# Patient Record
Sex: Male | Born: 1980 | Race: White | Hispanic: No | Marital: Married | Smoking: Never smoker
Health system: Southern US, Community
[De-identification: ages and names within clinical notes are randomized; demographics above are authoritative.]

## PROBLEM LIST (undated history)

## (undated) DIAGNOSIS — I10 Essential (primary) hypertension: Secondary | ICD-10-CM

## (undated) DIAGNOSIS — F1910 Other psychoactive substance abuse, uncomplicated: Secondary | ICD-10-CM

## (undated) DIAGNOSIS — G062 Extradural and subdural abscess, unspecified: Secondary | ICD-10-CM

## (undated) DIAGNOSIS — K759 Inflammatory liver disease, unspecified: Secondary | ICD-10-CM

## (undated) HISTORY — PX: OTHER SURGICAL HISTORY: SHX169

## (undated) HISTORY — PX: BACK SURGERY: SHX140

---

## 2018-03-05 DIAGNOSIS — G062 Extradural and subdural abscess, unspecified: Secondary | ICD-10-CM

## 2018-03-05 HISTORY — DX: Extradural and subdural abscess, unspecified: G06.2

## 2018-03-12 ENCOUNTER — Other Ambulatory Visit: Payer: Self-pay

## 2018-03-12 ENCOUNTER — Inpatient Hospital Stay (HOSPITAL_COMMUNITY)
Admission: EM | Admit: 2018-03-12 | Discharge: 2018-04-09 | DRG: 029 | Disposition: A | Discharge status: Home / Self Care | Payer: Self-pay | Attending: Internal Medicine | Admitting: Internal Medicine

## 2018-03-12 ENCOUNTER — Encounter (HOSPITAL_COMMUNITY): Payer: Self-pay

## 2018-03-12 ENCOUNTER — Emergency Department (HOSPITAL_COMMUNITY): Payer: Self-pay

## 2018-03-12 DIAGNOSIS — Z9114 Patient's other noncompliance with medication regimen: Secondary | ICD-10-CM

## 2018-03-12 DIAGNOSIS — R29898 Other symptoms and signs involving the musculoskeletal system: Secondary | ICD-10-CM

## 2018-03-12 DIAGNOSIS — M609 Myositis, unspecified: Secondary | ICD-10-CM | POA: Diagnosis present

## 2018-03-12 DIAGNOSIS — M869 Osteomyelitis, unspecified: Secondary | ICD-10-CM | POA: Diagnosis present

## 2018-03-12 DIAGNOSIS — F1110 Opioid abuse, uncomplicated: Secondary | ICD-10-CM | POA: Diagnosis present

## 2018-03-12 DIAGNOSIS — G062 Extradural and subdural abscess, unspecified: Secondary | ICD-10-CM | POA: Diagnosis present

## 2018-03-12 DIAGNOSIS — Z91128 Patient's intentional underdosing of medication regimen for other reason: Secondary | ICD-10-CM

## 2018-03-12 DIAGNOSIS — T464X6A Underdosing of angiotensin-converting-enzyme inhibitors, initial encounter: Secondary | ICD-10-CM | POA: Diagnosis present

## 2018-03-12 DIAGNOSIS — G8929 Other chronic pain: Secondary | ICD-10-CM | POA: Diagnosis present

## 2018-03-12 DIAGNOSIS — M545 Low back pain: Secondary | ICD-10-CM | POA: Diagnosis present

## 2018-03-12 DIAGNOSIS — E538 Deficiency of other specified B group vitamins: Secondary | ICD-10-CM

## 2018-03-12 DIAGNOSIS — B954 Other streptococcus as the cause of diseases classified elsewhere: Secondary | ICD-10-CM | POA: Diagnosis present

## 2018-03-12 DIAGNOSIS — G959 Disease of spinal cord, unspecified: Secondary | ICD-10-CM | POA: Diagnosis present

## 2018-03-12 DIAGNOSIS — M546 Pain in thoracic spine: Secondary | ICD-10-CM | POA: Diagnosis present

## 2018-03-12 DIAGNOSIS — R200 Anesthesia of skin: Secondary | ICD-10-CM

## 2018-03-12 DIAGNOSIS — F1990 Other psychoactive substance use, unspecified, uncomplicated: Secondary | ICD-10-CM | POA: Diagnosis present

## 2018-03-12 DIAGNOSIS — Y92009 Unspecified place in unspecified non-institutional (private) residence as the place of occurrence of the external cause: Secondary | ICD-10-CM

## 2018-03-12 DIAGNOSIS — G629 Polyneuropathy, unspecified: Secondary | ICD-10-CM | POA: Diagnosis not present

## 2018-03-12 DIAGNOSIS — Z6836 Body mass index (BMI) 36.0-36.9, adult: Secondary | ICD-10-CM

## 2018-03-12 DIAGNOSIS — E669 Obesity, unspecified: Secondary | ICD-10-CM

## 2018-03-12 DIAGNOSIS — Z79899 Other long term (current) drug therapy: Secondary | ICD-10-CM

## 2018-03-12 DIAGNOSIS — G2581 Restless legs syndrome: Secondary | ICD-10-CM | POA: Diagnosis present

## 2018-03-12 DIAGNOSIS — G061 Intraspinal abscess and granuloma: Principal | ICD-10-CM | POA: Diagnosis present

## 2018-03-12 DIAGNOSIS — I10 Essential (primary) hypertension: Secondary | ICD-10-CM | POA: Diagnosis present

## 2018-03-12 DIAGNOSIS — M4654 Other infective spondylopathies, thoracic region: Secondary | ICD-10-CM | POA: Diagnosis present

## 2018-03-12 HISTORY — DX: Essential (primary) hypertension: I10

## 2018-03-12 HISTORY — DX: Extradural and subdural abscess, unspecified: G06.2

## 2018-03-12 LAB — COMPREHENSIVE METABOLIC PANEL
ALT: 45 U/L — ABNORMAL HIGH (ref 0–44)
AST: 28 U/L (ref 15–41)
Albumin: 2.9 g/dL — ABNORMAL LOW (ref 3.5–5.0)
Alkaline Phosphatase: 125 U/L (ref 38–126)
Anion gap: 10 (ref 5–15)
BILIRUBIN TOTAL: 0.4 mg/dL (ref 0.3–1.2)
BUN: 7 mg/dL (ref 6–20)
CALCIUM: 8.7 mg/dL — AB (ref 8.9–10.3)
CO2: 24 mmol/L (ref 22–32)
Chloride: 99 mmol/L (ref 98–111)
Creatinine, Ser: 0.8 mg/dL (ref 0.61–1.24)
GFR calc Af Amer: >60 mL/min (ref >60)
Glucose, Bld: 117 mg/dL — ABNORMAL HIGH (ref 70–99)
Potassium: 3.9 mmol/L (ref 3.5–5.1)
Sodium: 133 mmol/L — ABNORMAL LOW (ref 135–145)
TOTAL PROTEIN: 7.2 g/dL (ref 6.5–8.1)

## 2018-03-12 LAB — CBC WITH DIFFERENTIAL/PLATELET
Abs Immature Granulocytes: 0.07 10*3/uL (ref 0.00–0.07)
Basophils Absolute: 0 10*3/uL (ref 0.0–0.1)
Basophils Relative: 0 %
Eosinophils Absolute: 0.1 10*3/uL (ref 0.0–0.5)
Eosinophils Relative: 1 %
HCT: 33.5 % — ABNORMAL LOW (ref 39.0–52.0)
Hemoglobin: 10.5 g/dL — ABNORMAL LOW (ref 13.0–17.0)
Immature Granulocytes: 1 %
Lymphocytes Relative: 18 %
Lymphs Abs: 1.5 10*3/uL (ref 0.7–4.0)
MCH: 27.1 pg (ref 26.0–34.0)
MCHC: 31.3 g/dL (ref 30.0–36.0)
MCV: 86.6 fL (ref 80.0–100.0)
MONOS PCT: 11 %
Monocytes Absolute: 0.9 10*3/uL (ref 0.1–1.0)
Neutro Abs: 5.9 10*3/uL (ref 1.7–7.7)
Neutrophils Relative %: 69 %
Platelets: 329 10*3/uL (ref 150–400)
RBC: 3.87 MIL/uL — ABNORMAL LOW (ref 4.22–5.81)
RDW: 14.2 % (ref 11.5–15.5)
WBC: 8.4 10*3/uL (ref 4.0–10.5)
nRBC: 0 % (ref 0.0–0.2)

## 2018-03-12 MED ORDER — LORAZEPAM 2 MG/ML IJ SOLN
1.0000 mg | Freq: Once | INTRAMUSCULAR | Status: AC
  Administered 2018-03-12: 1 mg via INTRAVENOUS
  Filled 2018-03-12: qty 1

## 2018-03-12 MED ORDER — MORPHINE SULFATE (PF) 4 MG/ML IV SOLN
8.0000 mg | Freq: Once | INTRAVENOUS | Status: AC
  Administered 2018-03-12: 8 mg via INTRAVENOUS
  Filled 2018-03-12: qty 2

## 2018-03-12 MED ORDER — ONDANSETRON HCL 4 MG/2ML IJ SOLN
4.0000 mg | Freq: Once | INTRAMUSCULAR | Status: AC
  Administered 2018-03-12: 4 mg via INTRAVENOUS
  Filled 2018-03-12: qty 2

## 2018-03-12 MED ORDER — SODIUM CHLORIDE 0.9 % IV BOLUS
1000.0000 mL | Freq: Once | INTRAVENOUS | Status: AC
  Administered 2018-03-12: 1000 mL via INTRAVENOUS

## 2018-03-12 NOTE — ED Triage Notes (Signed)
Pt states that he has been having back pain since Friday with numbness and tingling in both of his legs. Denies injury

## 2018-03-12 NOTE — ED Provider Notes (Signed)
[facility] EMERGENCY DEPARTMENT Provider Note   CSN: [id number] Arrival date & time: 03/12/18  2047    History   Chief Complaint Chief Complaint  Patient presents with  . Back Pain    HPI James [name] is a 38 y.o. male.     38 yo M with a significant past medical history of chronic back pain comes in with a chief complaint of worsening back pain bilateral lower extremity numbness difficulty with ambulation and difficulty urinating.  This been going on for the past couple days.  Slowly worsening.  Started in both of his legs and now is made its way up about half of his abdomen.  Describes it as a feeling that his legs are asleep, he feels that they are both weak as well and when he walks he feels like he has trouble feeling the ground.  States that he has trouble initiating his urinary stream and also has been having difficulty having a bowel movement.  Denies prior surgery on his back denies recent injection.  He denies fevers or chills.  Denies IV drug abuse.  Denies recent trauma.  The history is provided by the patient.  Back Pain  Location:  Thoracic spine and lumbar spine Quality:  Shooting and stabbing Pain severity:  Moderate Pain is:  Worse during the day Onset quality:  Sudden Timing:  Constant Progression:  Worsening Chronicity:  New Relieved by:  Nothing Worsened by:  Nothing Ineffective treatments:  None tried Associated symptoms: no abdominal pain, no chest pain, no fever and no headaches     Past Medical History:  Diagnosis Date  . Hypertension     There are no active problems to display for this patient.   History reviewed. No pertinent surgical history.      Home Medications    Prior to Admission medications   Medication Sig Start Date End Date Taking? Authorizing Provider  cyclobenzaprine (FLEXERIL) 10 MG tablet Take 10 mg by mouth 2 (two) times daily as needed for muscle spasms.  01/02/18  Yes [provider]    ibuprofen (ADVIL,MOTRIN) 200 MG tablet Take 800 mg by mouth every 4 (four) hours as needed (back pain).   Yes [provider]  lisinopril (PRINIVIL,ZESTRIL) 10 MG tablet Take 10 mg by mouth at bedtime. 01/02/18  Yes [provider]    Family History No family history on file.  Social History Social History   Tobacco Use  . Smoking status: Not on file  Substance Use Topics  . Alcohol use: Not on file  . Drug use: Not on file     Allergies   Patient has no known allergies.   Review of Systems Review of Systems  Constitutional: Negative for chills and fever.  HENT: Negative for congestion and facial swelling.   Eyes: Negative for discharge and visual disturbance.  Respiratory: Negative for shortness of breath.   Cardiovascular: Negative for chest pain and palpitations.  Gastrointestinal: Negative for abdominal pain, diarrhea and vomiting.  Musculoskeletal: Positive for back pain. Negative for arthralgias and myalgias.  Skin: Negative for color change and rash.  Neurological: Negative for tremors, syncope and headaches.  Psychiatric/Behavioral: Negative for confusion and dysphoric mood.     Physical Exam Updated Vital Signs BP 130/83   Pulse (!) 110   Temp 98.3 F (36.8 C) (Oral)   Resp 18   Ht 5\' 9"  (1.753 m)   Wt 113.4 kg   SpO2 98%   BMI 36.92 kg/m  Physical Exam Vitals signs and nursing note reviewed.  Constitutional:      Appearance: He is well-developed.  HENT:     Head: Normocephalic and atraumatic.  Eyes:     Pupils: Pupils are equal, round, and reactive to light.  Neck:     Musculoskeletal: Normal range of motion and neck supple.     Vascular: No JVD.  Cardiovascular:     Rate and Rhythm: Normal rate and regular rhythm.     Heart sounds: No murmur. No friction rub. No gallop.   Pulmonary:     Effort: No respiratory distress.     Breath sounds: No wheezing.  Abdominal:     General: There is no distension.     Tenderness:  There is no guarding or rebound.  Musculoskeletal: Normal range of motion.  Skin:    Coloration: Skin is not pale.     Findings: No rash.  Neurological:     Mental Status: He is alert and oriented to person, place, and time.     Deep Tendon Reflexes: Babinski sign absent on the right side. Babinski sign absent on the left side.     Reflex Scores:      Patellar reflexes are 2+ on the right side and 2+ on the left side.      Achilles reflexes are 2+ on the right side and 2+ on the left side.    Comments: About 4 beats of clonus with right lower extremity intact sensation to light touch in all nerve distributions of the foot.  Bilateral weakness to bilateral lower extremities 5-5 muscle strength bilateral upper extremities.  Psychiatric:        Behavior: Behavior normal.      ED Treatments / Results  Labs (all labs ordered are listed, but only abnormal results are displayed) Labs Reviewed  CBC WITH DIFFERENTIAL/PLATELET - Abnormal; Notable for the following components:      Result Value   RBC 3.87 (*)    Hemoglobin 10.5 (*)    HCT 33.5 (*)    All other components within normal limits  COMPREHENSIVE METABOLIC PANEL - Abnormal; Notable for the following components:   Sodium 133 (*)    Glucose, Bld 117 (*)    Calcium 8.7 (*)    Albumin 2.9 (*)    ALT 45 (*)    All other components within normal limits  URINALYSIS, ROUTINE W REFLEX MICROSCOPIC    EKG None  Radiology No results found.  Procedures Procedures (including critical care time)  Medications Ordered in ED Medications  sodium chloride 0.9 % bolus 1,000 mL (1,000 mLs Intravenous New Bag/Given 03/12/18 2141)  morphine 4 MG/ML injection 8 mg (8 mg Intravenous Given 03/12/18 2144)  ondansetron (ZOFRAN) injection 4 mg (4 mg Intravenous Given 03/12/18 2142)  LORazepam (ATIVAN) injection 1 mg (1 mg Intravenous Given 03/12/18 2226)  gadobutrol (GADAVIST) 1 MMOL/ML injection 10 mL (10 mLs Intravenous Contrast Given 03/13/18 0002)      Initial Impression / Assessment and Plan / ED Course  I have reviewed the triage vital signs and the nursing notes.  Pertinent labs & imaging results that were available during my care of the patient were reviewed by me and considered in my medical decision making (see chart for details).        38 yo M with a chief complaint of bilateral leg numbness and weakness along with low back pain.  Going on for the past couple days.  Story is somewhat concerning for cauda  equina.  Will order an MRI.  He did describe the feeling that the numbness that ascended from his waist up into his rib margin, will discuss with neurology for atypical Guillain-Barr presentation.  I discussed the case with Dr. Amada Jupiter, neurology he recommended an MRI of the C and T-spine based on the history that I provided.  If this is negative then he would have me agree contact him for bedside exam.   Awaiting MRI.   Discussed with Dr. Eudelia Bunch, please see his note for further details of care in the ED.   The patients results and plan were reviewed and discussed.   Any x-rays performed were independently reviewed by myself.   Differential diagnosis were considered with the presenting HPI.  Medications  sodium chloride 0.9 % bolus 1,000 mL (1,000 mLs Intravenous New Bag/Given 03/12/18 2141)  morphine 4 MG/ML injection 8 mg (8 mg Intravenous Given 03/12/18 2144)  ondansetron (ZOFRAN) injection 4 mg (4 mg Intravenous Given 03/12/18 2142)  LORazepam (ATIVAN) injection 1 mg (1 mg Intravenous Given 03/12/18 2226)  gadobutrol (GADAVIST) 1 MMOL/ML injection 10 mL (10 mLs Intravenous Contrast Given 03/13/18 0002)    Vitals:   03/12/18 2053 03/12/18 2111 03/12/18 2151  BP: (!) 146/89 130/83   Pulse: (!) 118 (!) 110   Resp: 20 18   Temp: 98.3 F (36.8 C)    TempSrc: Oral    SpO2: 99% 98%   Weight:   113.4 kg  Height:    (1.753 m)    Final diagnoses:  Leg weakness, bilateral  Bilateral leg numbness        Final Clinical Impressions(s) / ED Diagnoses   Final diagnoses:  Leg weakness, bilateral  Bilateral leg numbness    ED Discharge Orders    None       Melene Plan, DO 03/13/18 0003

## 2018-03-13 ENCOUNTER — Encounter (HOSPITAL_COMMUNITY)
Admission: EM | Disposition: A | Discharge status: Home / Self Care | Payer: Self-pay | Source: Home / Self Care | Attending: Internal Medicine

## 2018-03-13 ENCOUNTER — Emergency Department (HOSPITAL_COMMUNITY): Payer: Self-pay | Admitting: Anesthesiology

## 2018-03-13 ENCOUNTER — Inpatient Hospital Stay (HOSPITAL_COMMUNITY): Payer: Self-pay

## 2018-03-13 ENCOUNTER — Inpatient Hospital Stay: Payer: Self-pay

## 2018-03-13 ENCOUNTER — Encounter (HOSPITAL_COMMUNITY): Payer: Self-pay | Admitting: Emergency Medicine

## 2018-03-13 DIAGNOSIS — G9520 Unspecified cord compression: Secondary | ICD-10-CM

## 2018-03-13 DIAGNOSIS — Z978 Presence of other specified devices: Secondary | ICD-10-CM

## 2018-03-13 DIAGNOSIS — M869 Osteomyelitis, unspecified: Secondary | ICD-10-CM

## 2018-03-13 DIAGNOSIS — R7881 Bacteremia: Secondary | ICD-10-CM

## 2018-03-13 DIAGNOSIS — F1190 Opioid use, unspecified, uncomplicated: Secondary | ICD-10-CM

## 2018-03-13 DIAGNOSIS — I10 Essential (primary) hypertension: Secondary | ICD-10-CM

## 2018-03-13 DIAGNOSIS — L02818 Cutaneous abscess of other sites: Secondary | ICD-10-CM

## 2018-03-13 DIAGNOSIS — G061 Intraspinal abscess and granuloma: Secondary | ICD-10-CM | POA: Diagnosis present

## 2018-03-13 DIAGNOSIS — G062 Extradural and subdural abscess, unspecified: Secondary | ICD-10-CM | POA: Diagnosis present

## 2018-03-13 DIAGNOSIS — M4654 Other infective spondylopathies, thoracic region: Secondary | ICD-10-CM

## 2018-03-13 HISTORY — PX: THORACIC LAMINECTOMY FOR EPIDURAL ABSCESS: SHX6115

## 2018-03-13 LAB — BASIC METABOLIC PANEL
Anion gap: 9 (ref 5–15)
BUN: 7 mg/dL (ref 6–20)
CHLORIDE: 99 mmol/L (ref 98–111)
CO2: 25 mmol/L (ref 22–32)
Calcium: 8.4 mg/dL — ABNORMAL LOW (ref 8.9–10.3)
Creatinine, Ser: 0.87 mg/dL (ref 0.61–1.24)
GFR calc Af Amer: >60 mL/min (ref >60)
GFR calc non Af Amer: >60 mL/min (ref >60)
Glucose, Bld: 135 mg/dL — ABNORMAL HIGH (ref 70–99)
Potassium: 3.5 mmol/L (ref 3.5–5.1)
Sodium: 133 mmol/L — ABNORMAL LOW (ref 135–145)

## 2018-03-13 LAB — GRAM STAIN

## 2018-03-13 LAB — ECHOCARDIOGRAM COMPLETE
Height: 69 in
Weight: 4000 oz

## 2018-03-13 LAB — C-REACTIVE PROTEIN: CRP: 6.9 mg/dL — AB (ref <1.0)

## 2018-03-13 SURGERY — THORACIC LAMINECTOMY FOR EPIDURAL ABSCESS
Anesthesia: General | Site: Spine Thoracic

## 2018-03-13 MED ORDER — DIPHENHYDRAMINE HCL 50 MG/ML IJ SOLN
INTRAMUSCULAR | Status: AC
  Filled 2018-03-13: qty 1

## 2018-03-13 MED ORDER — PHENYLEPHRINE 40 MCG/ML (10ML) SYRINGE FOR IV PUSH (FOR BLOOD PRESSURE SUPPORT)
PREFILLED_SYRINGE | INTRAVENOUS | Status: AC
  Filled 2018-03-13: qty 30

## 2018-03-13 MED ORDER — THROMBIN 5000 UNITS EX SOLR
CUTANEOUS | Status: AC
  Filled 2018-03-13: qty 5000

## 2018-03-13 MED ORDER — EPHEDRINE 5 MG/ML INJ
INTRAVENOUS | Status: AC
  Filled 2018-03-13: qty 10

## 2018-03-13 MED ORDER — SODIUM CHLORIDE (PF) 0.9 % IJ SOLN
INTRAMUSCULAR | Status: AC
  Filled 2018-03-13: qty 10

## 2018-03-13 MED ORDER — GADOBUTROL 1 MMOL/ML IV SOLN
10.0000 mL | Freq: Once | INTRAVENOUS | Status: AC | PRN
  Administered 2018-03-13: 10 mL via INTRAVENOUS

## 2018-03-13 MED ORDER — HYDROMORPHONE HCL 1 MG/ML IJ SOLN: 1.0000 mg | INTRAMUSCULAR | Status: DC | PRN

## 2018-03-13 MED ORDER — VANCOMYCIN HCL 1000 MG IV SOLR
INTRAVENOUS | Status: DC | PRN
  Administered 2018-03-13: 1000 mg via TOPICAL

## 2018-03-13 MED ORDER — ONDANSETRON HCL 4 MG PO TABS: 4.0000 mg | ORAL_TABLET | Freq: Four times a day (QID) | ORAL | Status: DC | PRN

## 2018-03-13 MED ORDER — GLYCOPYRROLATE PF 0.2 MG/ML IJ SOSY
PREFILLED_SYRINGE | INTRAMUSCULAR | Status: AC
  Filled 2018-03-13: qty 1

## 2018-03-13 MED ORDER — HYDROMORPHONE HCL 1 MG/ML IJ SOLN
INTRAMUSCULAR | Status: AC
  Filled 2018-03-13: qty 1

## 2018-03-13 MED ORDER — ONDANSETRON HCL 4 MG/2ML IJ SOLN: 4.0000 mg | Freq: Four times a day (QID) | INTRAMUSCULAR | Status: DC | PRN

## 2018-03-13 MED ORDER — LIDOCAINE 2% (20 MG/ML) 5 ML SYRINGE
INTRAMUSCULAR | Status: AC
  Filled 2018-03-13: qty 10

## 2018-03-13 MED ORDER — PHENYLEPHRINE 40 MCG/ML (10ML) SYRINGE FOR IV PUSH (FOR BLOOD PRESSURE SUPPORT)
PREFILLED_SYRINGE | INTRAVENOUS | Status: AC
  Filled 2018-03-13: qty 10

## 2018-03-13 MED ORDER — ONDANSETRON HCL 4 MG/2ML IJ SOLN
INTRAMUSCULAR | Status: DC | PRN
  Administered 2018-03-13: 4 mg via INTRAVENOUS

## 2018-03-13 MED ORDER — DIAZEPAM 5 MG PO TABS
5.0000 mg | ORAL_TABLET | Freq: Four times a day (QID) | ORAL | Status: DC | PRN
  Administered 2018-03-14 – 2018-03-21 (×9): 5 mg via ORAL
  Administered 2018-03-22: 10 mg via ORAL
  Administered 2018-03-23 – 2018-04-01 (×9): 5 mg via ORAL
  Administered 2018-04-02: 10 mg via ORAL
  Administered 2018-04-03: 5 mg via ORAL
  Administered 2018-04-04: 10 mg via ORAL
  Administered 2018-04-05 – 2018-04-06 (×2): 5 mg via ORAL
  Filled 2018-03-13: qty 1
  Filled 2018-03-13: qty 2
  Filled 2018-03-13 (×11): qty 1
  Filled 2018-03-13 (×2): qty 2
  Filled 2018-03-13 (×10): qty 1

## 2018-03-13 MED ORDER — SODIUM CHLORIDE 0.9 % IV SOLN: 2.0000 g | Freq: Once | INTRAVENOUS | Status: DC

## 2018-03-13 MED ORDER — SODIUM CHLORIDE 0.9% FLUSH: 3.0000 mL | INTRAVENOUS | Status: DC | PRN

## 2018-03-13 MED ORDER — OXYCODONE HCL 5 MG/5ML PO SOLN: 5.0000 mg | Freq: Once | ORAL | Status: DC | PRN

## 2018-03-13 MED ORDER — MIDAZOLAM HCL 5 MG/5ML IJ SOLN
INTRAMUSCULAR | Status: DC | PRN
  Administered 2018-03-13: 2 mg via INTRAVENOUS

## 2018-03-13 MED ORDER — SODIUM CHLORIDE 0.9 % IV SOLN
1.0000 g | Freq: Once | INTRAVENOUS | Status: AC
  Administered 2018-03-13: 1 g via INTRAVENOUS
  Filled 2018-03-13: qty 10

## 2018-03-13 MED ORDER — MIDAZOLAM HCL 2 MG/2ML IJ SOLN
INTRAMUSCULAR | Status: AC
  Filled 2018-03-13: qty 2

## 2018-03-13 MED ORDER — BISACODYL 10 MG RE SUPP: 10.0000 mg | Freq: Every day | RECTAL | Status: DC | PRN

## 2018-03-13 MED ORDER — LIDOCAINE HCL (CARDIAC) PF 100 MG/5ML IV SOSY
PREFILLED_SYRINGE | INTRAVENOUS | Status: DC | PRN
  Administered 2018-03-13: 60 mg via INTRATRACHEAL

## 2018-03-13 MED ORDER — OXYCODONE HCL 5 MG PO TABS
10.0000 mg | ORAL_TABLET | ORAL | Status: DC | PRN
  Administered 2018-03-13 – 2018-03-15 (×8): 10 mg via ORAL
  Filled 2018-03-13 (×8): qty 2

## 2018-03-13 MED ORDER — DIPHENHYDRAMINE HCL 50 MG/ML IJ SOLN
INTRAMUSCULAR | Status: DC | PRN
  Administered 2018-03-13: 25 mg via INTRAVENOUS

## 2018-03-13 MED ORDER — ROCURONIUM BROMIDE 50 MG/5ML IV SOSY
PREFILLED_SYRINGE | INTRAVENOUS | Status: AC
  Filled 2018-03-13: qty 15

## 2018-03-13 MED ORDER — PHENYLEPHRINE HCL 10 MG/ML IJ SOLN
INTRAMUSCULAR | Status: DC | PRN
  Administered 2018-03-13 (×2): 40 ug via INTRAVENOUS

## 2018-03-13 MED ORDER — MENTHOL 3 MG MT LOZG: 1.0000 | LOZENGE | OROMUCOSAL | Status: DC | PRN

## 2018-03-13 MED ORDER — PROMETHAZINE HCL 25 MG/ML IJ SOLN: 6.2500 mg | INTRAMUSCULAR | Status: DC | PRN

## 2018-03-13 MED ORDER — POLYETHYLENE GLYCOL 3350 17 G PO PACK
17.0000 g | PACK | Freq: Every day | ORAL | Status: DC | PRN
  Administered 2018-03-14: 17 g via ORAL
  Filled 2018-03-13: qty 1

## 2018-03-13 MED ORDER — ARTIFICIAL TEARS OPHTHALMIC OINT
TOPICAL_OINTMENT | OPHTHALMIC | Status: AC
  Filled 2018-03-13: qty 3.5

## 2018-03-13 MED ORDER — SUCCINYLCHOLINE CHLORIDE 200 MG/10ML IV SOSY
PREFILLED_SYRINGE | INTRAVENOUS | Status: AC
  Filled 2018-03-13: qty 30

## 2018-03-13 MED ORDER — ACETAMINOPHEN 10 MG/ML IV SOLN
INTRAVENOUS | Status: AC
  Filled 2018-03-13: qty 100

## 2018-03-13 MED ORDER — ACETAMINOPHEN 10 MG/ML IV SOLN
1000.0000 mg | Freq: Once | INTRAVENOUS | Status: DC | PRN
  Administered 2018-03-13: 1000 mg via INTRAVENOUS

## 2018-03-13 MED ORDER — FENTANYL CITRATE (PF) 250 MCG/5ML IJ SOLN
INTRAMUSCULAR | Status: AC
  Filled 2018-03-13: qty 5

## 2018-03-13 MED ORDER — FLEET ENEMA 7-19 GM/118ML RE ENEM: 1.0000 | ENEMA | Freq: Once | RECTAL | Status: DC | PRN

## 2018-03-13 MED ORDER — THROMBIN 20000 UNITS EX SOLR
CUTANEOUS | Status: DC | PRN
  Administered 2018-03-13: 04:00:00 via TOPICAL

## 2018-03-13 MED ORDER — SODIUM CHLORIDE 0.9 % IV SOLN: 250.0000 mL | INTRAVENOUS | Status: DC

## 2018-03-13 MED ORDER — SODIUM CHLORIDE 0.9 % IV SOLN
INTRAVENOUS | Status: DC | PRN
  Administered 2018-03-13: 04:00:00

## 2018-03-13 MED ORDER — SUGAMMADEX SODIUM 500 MG/5ML IV SOLN
INTRAVENOUS | Status: AC
  Filled 2018-03-13: qty 5

## 2018-03-13 MED ORDER — PROPOFOL 10 MG/ML IV BOLUS
INTRAVENOUS | Status: AC
  Filled 2018-03-13: qty 40

## 2018-03-13 MED ORDER — THROMBIN 20000 UNITS EX SOLR
CUTANEOUS | Status: AC
  Filled 2018-03-13: qty 20000

## 2018-03-13 MED ORDER — SODIUM CHLORIDE 0.9 % IV BOLUS
1000.0000 mL | Freq: Once | INTRAVENOUS | Status: AC
  Administered 2018-03-13: 1000 mL via INTRAVENOUS

## 2018-03-13 MED ORDER — SODIUM CHLORIDE 0.9% FLUSH
3.0000 mL | Freq: Two times a day (BID) | INTRAVENOUS | Status: DC
  Administered 2018-03-13 – 2018-04-05 (×8): 3 mL via INTRAVENOUS

## 2018-03-13 MED ORDER — VANCOMYCIN HCL 1000 MG IV SOLR
INTRAVENOUS | Status: AC
  Filled 2018-03-13: qty 1000

## 2018-03-13 MED ORDER — VANCOMYCIN HCL IN DEXTROSE 1-5 GM/200ML-% IV SOLN
INTRAVENOUS | Status: AC
  Filled 2018-03-13: qty 200

## 2018-03-13 MED ORDER — PROPOFOL 10 MG/ML IV BOLUS
INTRAVENOUS | Status: DC | PRN
  Administered 2018-03-13: 60 mg via INTRAVENOUS
  Administered 2018-03-13: 200 mg via INTRAVENOUS

## 2018-03-13 MED ORDER — METRONIDAZOLE IN NACL 5-0.79 MG/ML-% IV SOLN: 500.0000 mg | Freq: Four times a day (QID) | INTRAVENOUS | Status: DC

## 2018-03-13 MED ORDER — LISINOPRIL 10 MG PO TABS
10.0000 mg | ORAL_TABLET | Freq: Every day | ORAL | Status: DC
  Administered 2018-03-13 – 2018-03-14 (×2): 10 mg via ORAL
  Filled 2018-03-13 (×2): qty 1

## 2018-03-13 MED ORDER — LACTATED RINGERS IV SOLN
INTRAVENOUS | Status: DC | PRN
  Administered 2018-03-13 (×2): via INTRAVENOUS

## 2018-03-13 MED ORDER — FENTANYL CITRATE (PF) 250 MCG/5ML IJ SOLN
INTRAMUSCULAR | Status: DC | PRN
  Administered 2018-03-13 (×4): 100 ug via INTRAVENOUS
  Administered 2018-03-13 (×2): 50 ug via INTRAVENOUS

## 2018-03-13 MED ORDER — HYDROCODONE-ACETAMINOPHEN 10-325 MG PO TABS
1.0000 | ORAL_TABLET | ORAL | Status: DC | PRN
  Administered 2018-03-13 – 2018-03-15 (×8): 1 via ORAL
  Filled 2018-03-13 (×8): qty 1

## 2018-03-13 MED ORDER — VANCOMYCIN HCL 10 G IV SOLR
1500.0000 mg | Freq: Two times a day (BID) | INTRAVENOUS | Status: DC
  Administered 2018-03-13 – 2018-03-14 (×3): 1500 mg via INTRAVENOUS
  Filled 2018-03-13 (×3): qty 1500

## 2018-03-13 MED ORDER — KETOROLAC TROMETHAMINE 30 MG/ML IJ SOLN
INTRAMUSCULAR | Status: DC | PRN
  Administered 2018-03-13: 30 mg via INTRAVENOUS

## 2018-03-13 MED ORDER — BUPIVACAINE HCL (PF) 0.25 % IJ SOLN
INTRAMUSCULAR | Status: DC | PRN
  Administered 2018-03-13: 20 mL

## 2018-03-13 MED ORDER — THROMBIN 5000 UNITS EX SOLR
OROMUCOSAL | Status: DC | PRN
  Administered 2018-03-13: 04:00:00 via TOPICAL

## 2018-03-13 MED ORDER — ACETAMINOPHEN 325 MG PO TABS: 650.0000 mg | ORAL_TABLET | ORAL | Status: DC | PRN

## 2018-03-13 MED ORDER — KETOROLAC TROMETHAMINE 30 MG/ML IJ SOLN: 30.0000 mg | Freq: Once | INTRAMUSCULAR | Status: DC

## 2018-03-13 MED ORDER — VANCOMYCIN HCL 1000 MG IV SOLR
INTRAVENOUS | Status: DC | PRN
  Administered 2018-03-13: 1000 mg via INTRAVENOUS

## 2018-03-13 MED ORDER — PHENOL 1.4 % MT LIQD: 1.0000 | OROMUCOSAL | Status: DC | PRN

## 2018-03-13 MED ORDER — ONDANSETRON HCL 4 MG/2ML IJ SOLN
INTRAMUSCULAR | Status: AC
  Filled 2018-03-13: qty 2

## 2018-03-13 MED ORDER — BACITRACIN ZINC 500 UNIT/GM EX OINT
TOPICAL_OINTMENT | CUTANEOUS | Status: DC | PRN
  Administered 2018-03-13: 1 via TOPICAL

## 2018-03-13 MED ORDER — BUPIVACAINE HCL (PF) 0.25 % IJ SOLN
INTRAMUSCULAR | Status: AC
  Filled 2018-03-13: qty 30

## 2018-03-13 MED ORDER — HYDROMORPHONE HCL 1 MG/ML IJ SOLN
0.2500 mg | INTRAMUSCULAR | Status: DC | PRN
  Administered 2018-03-13 (×2): 0.5 mg via INTRAVENOUS

## 2018-03-13 MED ORDER — ACETAMINOPHEN 650 MG RE SUPP: 650.0000 mg | RECTAL | Status: DC | PRN

## 2018-03-13 MED ORDER — SODIUM CHLORIDE 0.9 % IV SOLN
1.0000 g | Freq: Two times a day (BID) | INTRAVENOUS | Status: DC
  Administered 2018-03-13 (×2): 1 g via INTRAVENOUS
  Filled 2018-03-13 (×3): qty 10

## 2018-03-13 MED ORDER — SUGAMMADEX SODIUM 500 MG/5ML IV SOLN
INTRAVENOUS | Status: DC | PRN
  Administered 2018-03-13: 300 mg via INTRAVENOUS

## 2018-03-13 MED ORDER — OXYCODONE HCL 5 MG PO TABS: 5.0000 mg | ORAL_TABLET | Freq: Once | ORAL | Status: DC | PRN

## 2018-03-13 MED ORDER — 0.9 % SODIUM CHLORIDE (POUR BTL) OPTIME
TOPICAL | Status: DC | PRN
  Administered 2018-03-13: 1000 mL

## 2018-03-13 MED ORDER — SUCCINYLCHOLINE CHLORIDE 20 MG/ML IJ SOLN
INTRAMUSCULAR | Status: DC | PRN
  Administered 2018-03-13: 120 mg via INTRAVENOUS

## 2018-03-13 MED ORDER — ROCURONIUM BROMIDE 100 MG/10ML IV SOLN
INTRAVENOUS | Status: DC | PRN
  Administered 2018-03-13: 50 mg via INTRAVENOUS
  Administered 2018-03-13: 20 mg via INTRAVENOUS

## 2018-03-13 SURGICAL SUPPLY — 53 items
BAG DECANTER FOR FLEXI CONT (MISCELLANEOUS) ×3 IMPLANT
BENZOIN TINCTURE PRP APPL 2/3 (GAUZE/BANDAGES/DRESSINGS) ×3 IMPLANT
BLADE CLIPPER SURG (BLADE) ×3 IMPLANT
BUR CUTTER 7.0 ROUND (BURR) ×3 IMPLANT
BUR MATCHSTICK NEURO 3.0 LAGG (BURR) ×3 IMPLANT
CANISTER SUCT 3000ML PPV (MISCELLANEOUS) ×3 IMPLANT
CARTRIDGE OIL MAESTRO DRILL (MISCELLANEOUS) ×1 IMPLANT
CLOSURE WOUND 1/2 X4 (GAUZE/BANDAGES/DRESSINGS) ×1
DERMABOND ADVANCED (GAUZE/BANDAGES/DRESSINGS) ×2
DERMABOND ADVANCED .7 DNX12 (GAUZE/BANDAGES/DRESSINGS) ×1 IMPLANT
DIFFUSER DRILL AIR PNEUMATIC (MISCELLANEOUS) ×3 IMPLANT
DRAPE LAPAROTOMY 100X72X124 (DRAPES) ×3 IMPLANT
DRAPE MICROSCOPE LEICA (MISCELLANEOUS) ×3 IMPLANT
DRAPE SURG 17X23 STRL (DRAPES) ×6 IMPLANT
DRSG OPSITE 4X5.5 SM (GAUZE/BANDAGES/DRESSINGS) ×3 IMPLANT
DRSG OPSITE POSTOP 4X6 (GAUZE/BANDAGES/DRESSINGS) ×3 IMPLANT
ELECT REM PT RETURN 9FT ADLT (ELECTROSURGICAL) ×3
ELECTRODE REM PT RTRN 9FT ADLT (ELECTROSURGICAL) ×1 IMPLANT
EVACUATOR 1/8 PVC DRAIN (DRAIN) ×3 IMPLANT
GAUZE 4X4 16PLY RFD (DISPOSABLE) IMPLANT
GAUZE SPONGE 4X4 12PLY STRL (GAUZE/BANDAGES/DRESSINGS) IMPLANT
GLOVE BIOGEL PI IND STRL 7.0 (GLOVE) ×2 IMPLANT
GLOVE BIOGEL PI IND STRL 7.5 (GLOVE) ×2 IMPLANT
GLOVE BIOGEL PI IND STRL 8 (GLOVE) ×2 IMPLANT
GLOVE BIOGEL PI INDICATOR 7.0 (GLOVE) ×4
GLOVE BIOGEL PI INDICATOR 7.5 (GLOVE) ×4
GLOVE BIOGEL PI INDICATOR 8 (GLOVE) ×4
GLOVE ECLIPSE 7.5 STRL STRAW (GLOVE) ×3 IMPLANT
GLOVE ECLIPSE 9.0 STRL (GLOVE) ×3 IMPLANT
GOWN STRL REUS W/ TWL LRG LVL3 (GOWN DISPOSABLE) IMPLANT
GOWN STRL REUS W/ TWL XL LVL3 (GOWN DISPOSABLE) ×1 IMPLANT
GOWN STRL REUS W/TWL 2XL LVL3 (GOWN DISPOSABLE) ×6 IMPLANT
GOWN STRL REUS W/TWL LRG LVL3 (GOWN DISPOSABLE)
GOWN STRL REUS W/TWL XL LVL3 (GOWN DISPOSABLE) ×2
HEMOSTAT POWDER KIT SURGIFOAM (HEMOSTASIS) ×3 IMPLANT
KIT BASIN OR (CUSTOM PROCEDURE TRAY) ×3 IMPLANT
KIT TURNOVER KIT B (KITS) ×3 IMPLANT
NEEDLE HYPO 22GX1.5 SAFETY (NEEDLE) ×3 IMPLANT
NEEDLE HYPO 25X1 1.5 SAFETY (NEEDLE) ×3 IMPLANT
NS IRRIG 1000ML POUR BTL (IV SOLUTION) ×3 IMPLANT
OIL CARTRIDGE MAESTRO DRILL (MISCELLANEOUS) ×3
PACK LAMINECTOMY NEURO (CUSTOM PROCEDURE TRAY) ×3 IMPLANT
RUBBERBAND STERILE (MISCELLANEOUS) ×6 IMPLANT
SPONGE SURGIFOAM ABS GEL 100 (HEMOSTASIS) ×3 IMPLANT
STRIP CLOSURE SKIN 1/2X4 (GAUZE/BANDAGES/DRESSINGS) ×2 IMPLANT
SUT VIC AB 0 CT1 18XCR BRD8 (SUTURE) ×1 IMPLANT
SUT VIC AB 0 CT1 8-18 (SUTURE) ×2
SUT VIC AB 2-0 CT1 18 (SUTURE) ×3 IMPLANT
SUT VIC AB 3-0 SH 8-18 (SUTURE) ×3 IMPLANT
TOWEL GREEN STERILE (TOWEL DISPOSABLE) ×3 IMPLANT
TOWEL GREEN STERILE FF (TOWEL DISPOSABLE) ×3 IMPLANT
TRAY FOLEY MTR SLVR 16FR STAT (SET/KITS/TRAYS/PACK) IMPLANT
WATER STERILE IRR 1000ML POUR (IV SOLUTION) ×3 IMPLANT

## 2018-03-13 NOTE — Anesthesia Postprocedure Evaluation (Signed)
Anesthesia Post Note  Patient: James [name]  Procedure(s) Performed: THORACIC LAMINECTOMY FOR EPIDURAL ABSCESS (N/A Spine Thoracic)     Patient location during evaluation: PACU Anesthesia Type: General Level of consciousness: awake and alert Pain management: pain level controlled Vital Signs Assessment: post-procedure vital signs reviewed and stable Respiratory status: spontaneous breathing, nonlabored ventilation, respiratory function stable and patient connected to nasal cannula oxygen Cardiovascular status: blood pressure returned to baseline and stable Postop Assessment: no apparent nausea or vomiting Anesthetic complications: no    Last Vitals:  Vitals:   03/13/18 0549 03/13/18 0604  BP:  118/81  Pulse: 94 92  Resp: (!) 21 20  Temp: (!) 36.3 C 36.9 C  SpO2: 100% 99%    Last Pain:  Vitals:   03/13/18 0604  TempSrc: Oral  PainSc: 9                  Seibert Keeter P Rasmus Preusser

## 2018-03-13 NOTE — Op Note (Signed)
Date of procedure: 03/13/2018  Date of dictation: Same  Service: Neurosurgery  Preoperative diagnosis: T3-T4 epidural abscess with myelopathy  Postoperative diagnosis: Same  Procedure Name: Emergent T3-4 decompressive laminectomy with evacuation of epidural abscess, resection of epidural phlegmon, microdissection  Surgeon:Divina Neale A.Llana Deshazo, M.D.  Asst. Surgeon: None  Anesthesia: General  Indication: 38 year old male with history of IV drug abuse presents with mid back pain with bilateral lower extremity numbness paresthesias and some weakness.  Work-up demonstrates evidence of a large dorsal fluid collection in his upper thoracic spinal canal consistent with an epidural abscess.  Patient presents now for emergent decompressive surgery.  Operative note: After induction of anesthesia, patient position prone onto bolsters with his head fixed in the [place] pin headrest.  Patient's upper thoracic region was prepped and draped sterilely.  Incision made overlying T free T4.  Retractor placed.  Laminectomy then performed using Leksell rongeurs Kerrison under the high-speed drill to remove the entire lamina of L3 and the majority of the lamina of L4.  Ligament flavum elevated and resected.  Epidural pus was encountered.  Samples were taken and sent to the lab for stat culture.  Ligament flavum elevated and resected.  There was a dense epidural phlegmon which required micro dissection using the microscope to dissected from the underlying thecal sac.  At this point a very thorough decompression had been achieved.  There was no evidence of injury to thecal sac, spinal cord or nerve roots.  Wound is then irrigated fanlike solution.  Gelfoam was placed topically for hemostasis then removed.  Vancomycin powder was placed in deep wound space.  A medium Hemovac drain was left in the epidural space.  Wounds and closed in layers of Vicryl sutures.  Steri-Strips and sterile dressing were applied.  No apparent  complications.  Patient tolerated the procedure well and he returns to the recovery room postop.

## 2018-03-13 NOTE — Progress Notes (Signed)
Patient received from PACU at 0604;alert and oriented in stable condition. Pt oriented to room and plan of care. Call bell left within reach. Patient instructed not to get up without assistance. He voiced understanding.

## 2018-03-13 NOTE — Progress Notes (Signed)
  Echocardiogram 2D Echocardiogram has been performed.  James [name] 03/13/2018, 2:00 PM

## 2018-03-13 NOTE — Care Management Note (Signed)
Case Management Note  Patient Details  Name: James [name] MRN: [id number] Date of Birth: October 28, 1980  Subjective/Objective:                    Action/Plan:  Confirmed face sheet address and phone number.   Patient lives with wife. Patient has a PCP in [place], however does not know the MD's name or practice name or address. Patient thinks he may have insurance for one or two more days, but does not know insurance name. His wife has all information and will be visiting today. He will not be able to follow up with his PCP once insurance expires.Patient interested in [facility].   Patient last used Heroin on Saturday March 18, 2018 . Would not be candidate for home IV ABX with PICC  Will continue to follow.  Expected Discharge Date:                  Expected Discharge Plan:     In-House Referral:  Financial Counselor  Discharge planning Services  CM Consult  Post Acute Care Choice:    Choice offered to:  Patient  DME Arranged:    DME Agency:     HH Arranged:    HH Agency:     Status of Service:  In process, will continue to follow  If discussed at Long Length of Stay Meetings, dates discussed:    Additional Comments:  Kingsley Plan, RN 03/13/2018, 11:20 AM

## 2018-03-13 NOTE — Evaluation (Signed)
Physical Therapy Evaluation Patient Details Name: James [name] MRN: [id number] DOB: 1980/07/09 Today's Date: 03/13/2018   History of Present Illness  38 yo admitted with back pain and some progressive numbness and weakness bil LEs and UEs, difficulty walking; Now s/p Emergent T3-4 decompressive laminectomy with evacuation of epidural abscess, resection of epidural phlegmon, microdissection;  has a past medical history of Hypertension.and IV drug use  Clinical Impression   Patient is s/p above surgery resulting in functional limitations due to the deficits listed below (see PT Problem List). Independent prior to admission; presenting overall moving well, with some gait deviations related to bil LE dyscoordination, but he reports it is much improved compared to pre-op; I anticipate one more PT session for gait and stair training;  Patient will benefit from skilled PT to increase their independence and safety with mobility to allow discharge to the venue listed below.       Follow Up Recommendations No PT follow up    Equipment Recommendations  None recommended by PT    Recommendations for Other Services       Precautions / Restrictions Precautions Precautions: Back(for comfort)      Mobility  Bed Mobility Overal bed mobility: Independent                Transfers Overall transfer level: Independent                  Ambulation/Gait Ambulation/Gait assistance: Supervision;Modified independent (Device/Increase time) Gait Distance (Feet): 550 Feet Assistive device: None Gait Pattern/deviations: Step-through pattern Gait velocity: aprpoaching WNL   General Gait Details: Overall walking well; noting heavy footfalls R and L, indicative of slightly decr stability in stance; reports feeling like he is weak and uncoordinated; Supervision, progressing to Modifiend independence; Noted less stance stability in R stance than L  Stairs            Wheelchair Mobility    Modified Rankin (Stroke Patients Only)       Balance Overall balance assessment: Independent                                           Pertinent Vitals/Pain Pain Assessment: 0-10 Pain Score: 6  Pain Location: upper back pain Pain Descriptors / Indicators: Aching Pain Intervention(s): Premedicated before session    Home Living Family/patient expects to be discharged to:: Private residence Living Arrangements: Spouse/significant other Available Help at Discharge: Family;Available PRN/intermittently Type of Home: House Home Access: Stairs to enter Entrance Stairs-Rails: None Entrance Stairs-Number of Steps: 4 Home Layout: Two level        Prior Function Level of Independence: Independent               Hand Dominance        Extremity/Trunk Assessment   Upper Extremity Assessment Upper Extremity Assessment: Overall WFL for tasks assessed    Lower Extremity Assessment Lower Extremity Assessment: RLE deficits/detail;LLE deficits/detail RLE Deficits / Details: Reports feeling less stabe in stance both R and LLEs, R worse than L RLE Coordination: decreased gross motor LLE Deficits / Details: Reports feeling less stabe in stance both R and LLEs, R worse than L       Communication   Communication: No difficulties  Cognition Arousal/Alertness: Awake/alert Behavior During Therapy: WFL for tasks assessed/performed Overall Cognitive Status: Within Functional Limits for tasks assessed  General Comments General comments (skin integrity, edema, etc.): Taught back precautions for comfort    Exercises     Assessment/Plan    PT Assessment Patient needs continued PT services  PT Problem List Decreased activity tolerance;Pain       PT Treatment Interventions Gait training;Stair training;Functional mobility training    PT Goals (Current goals can be found in the Care Plan section)  Acute  Rehab PT Goals Patient Stated Goal: heal the infection PT Goal Formulation: With patient Time For Goal Achievement: 03/20/18 Potential to Achieve Goals: Good    Frequency Min 3X/week   Barriers to discharge        Co-evaluation               AM-PAC PT "6 Clicks" Mobility  Outcome Measure Help needed turning from your back to your side while in a flat bed without using bedrails?: None Help needed moving from lying on your back to sitting on the side of a flat bed without using bedrails?: None Help needed moving to and from a bed to a chair (including a wheelchair)?: None Help needed standing up from a chair using your arms (e.g., wheelchair or bedside chair)?: None Help needed to walk in hospital room?: None Help needed climbing 3-5 steps with a railing? : None 6 Click Score: 24    End of Session   Activity Tolerance: Patient tolerated treatment well Patient left: in bed;with call bell/phone within reach Nurse Communication: Mobility status PT Visit Diagnosis: Unsteadiness on feet (R26.81);Pain Pain - part of body: (Upper back)    Time: 1114-1130 PT Time Calculation (min) (ACUTE ONLY): 16 min   Charges:   PT Evaluation $PT Eval Low Complexity: 1 Low          Van Clines, PT  Acute Rehabilitation Services Pager [phone] Office [phone]   Levi Aland 03/13/2018, 1:34 PM

## 2018-03-13 NOTE — Consult Note (Signed)
[facility]    Date of Admission:  03/12/2018     Total days of antibiotics 0               Reason for Consult: Epidural abscess / osteomyelitis   Referring Provider: Pool Primary Care Provider: Patient, No Pcp Per   Assessment/Plan:  James [name] is a 38 y/o male IVDU presenting with increasing back pain, lower extremity weakness, and difficulty urinating found to have an epidural abscess, epidural intraspinal abscess and right 3rd rib osteomyelitis/abscess s/p emergent decompressive laminectomy and abscess evacuation. Surgical specimen with gram stain showing no organisms and cultures pending. Blood cultures remain pending. Agree with continuation of broad spectrum coverage with vancomycin and ceftriaxone pending culture results. He will require prolonged IV therapy and is not a candidate for home health given his active heroin usage and will likely need to remain inpatient for the duration of treatment. He is interested in treatment and has tried Suboxone in the past.   1. Agree with broad spectrum coverage with vancomycin and ceftriaxone with narrowing pending culture results as able. 2. Monitor for fevers and culture results.  3. Monitor renal function while on vancomycin with adjustments per pharmacy. 4. Check Hepatitis C and HIV. 5. Opioid use disorder per primary team. May benefit from IM Teaching Services consult for Suboxone.    Active Problems:   Epidural abscess   Epidural intraspinal abscess   . HYDROmorphone      . lisinopril  10 mg Oral QHS  . sodium chloride flush  3 mL Intravenous Q12H     HPI: James [name] is a 37 y.o. male hypertension and IV drug use who presented to the ED with acute onset worsening back pain with bilateral lower extremity weakness and difficulty urinating for about 24 hours prior to presentation.  MRI of the cervical and thoracic spine showing a dorsal epidural phelgmon T1 through T4-5 with 8 x 10 x 34 mm  epidural abscess from T2-3 through T4-5 with resultant severe cord compression and edema. There is Left T8-9, right T9-10, and left T10-11 facet septic arthritis; and right third rib 2.8 / 2.5 cm osteomyelitis/abscess.   Neurosurgery consulted and performed emergent T3-4 decompressive laminectomy with evacuation of epidural abscess and resection of epidural phelgmon. During the procedure epidural pus was encountered and sent for cultures.  James [name] denies any fevers prior to presentation and has not been on antibiotics. Has had worsening back pain. No previous history of trauma or significant injury to his back. Is an active IV drug user with primary being heroin that injects into his right upper extremity with the last use being on Saturday. No fevers since being admitted to the hospital. Blood cultures and surgical specimens are pending. There has been no growth to date on gram stains. Current antimicrobial therapy with broad spectrum vancomycin and ceftriaxone.   Review of Systems: Review of Systems  Constitutional: Negative for chills, fever and weight loss.  Respiratory: Negative for cough, shortness of breath and wheezing.   Cardiovascular: Negative for chest pain and leg swelling.  Gastrointestinal: Negative for abdominal pain, constipation, diarrhea, nausea and vomiting.  Musculoskeletal: Positive for back pain (upper back).  Skin: Negative for rash.  Neurological: Positive for weakness. Negative for dizziness, seizures and headaches.     Past Medical History:  Diagnosis Date  . Hypertension     Social History   Tobacco Use  . Smoking status: Not on file  Substance  Use Topics  . Alcohol use: Not on file  . Drug use: Not on file    History reviewed. No pertinent family history.  No Known Allergies  OBJECTIVE: Blood pressure 118/81, pulse 92, temperature 98.5 F (36.9 C), temperature source Oral, resp. rate 20, height 5\' 9"  (1.753 m), weight 113.4 kg, SpO2 99  %.  Physical Exam Constitutional:      General: He is not in acute distress.    Appearance: He is well-developed.     Comments: Seated on the side of the bed; pleasant.   Cardiovascular:     Rate and Rhythm: Normal rate and regular rhythm.     Heart sounds: Normal heart sounds. No murmur.  Pulmonary:     Effort: Pulmonary effort is normal. No respiratory distress.     Breath sounds: Normal breath sounds. No wheezing or rhonchi.  Musculoskeletal:     Comments: Surgical site appear with honeycomb dressing hemovac drain in place and to suction. There is serosanguinous drainage present.   Skin:    General: Skin is warm and dry.  Neurological:     Mental Status: He is alert.  Psychiatric:        Mood and Affect: Mood normal.        Judgment: Judgment normal.     Lab Results Lab Results  Component Value Date   WBC 8.4 03/12/2018   HGB 10.5 (L) 03/12/2018   HCT 33.5 (L) 03/12/2018   MCV 86.6 03/12/2018   PLT 329 03/12/2018    Lab Results  Component Value Date   CREATININE 0.87 03/13/2018   BUN 7 03/13/2018   NA 133 (L) 03/13/2018   K 3.5 03/13/2018   CL 99 03/13/2018   CO2 25 03/13/2018    Lab Results  Component Value Date   ALT 45 (H) 03/12/2018   AST 28 03/12/2018   ALKPHOS 125 03/12/2018   BILITOT 0.4 03/12/2018     Microbiology: Recent Results (from the past 240 hour(s))  Gram stain     Status: None   Collection Time: 03/13/18  3:43 AM  Result Value Ref Range Status   Specimen Description ABSCESS  Final   Special Requests THORACIC EPIDURAL AEROBIC SWAB ONLY PER DR POOL  Final   Gram Stain   Final    FEW WBC PRESENT, PREDOMINANTLY PMN NO ORGANISMS SEEN Performed at [facility], [address] [place], [place] [address]    Report Status 03/13/2018 FINAL  Final     Marcos Eke, NP [facility] [phone] Pager  03/13/2018  10:52 AM

## 2018-03-13 NOTE — Transfer of Care (Signed)
Immediate Anesthesia Transfer of Care Note  Patient: James [name]  Procedure(s) Performed: THORACIC LAMINECTOMY FOR EPIDURAL ABSCESS (N/A Spine Thoracic)  Patient Location: PACU  Anesthesia Type:General  Level of Consciousness: drowsy  Airway & Oxygen Therapy: Patient Spontanous Breathing and Patient connected to face mask oxygen  Post-op Assessment: Report given to RN and Post -op Vital signs reviewed and stable  Post vital signs: Reviewed and stable  Last Vitals:  Vitals Value Taken Time  BP    Temp    Pulse 95 03/13/2018  5:07 AM  Resp 27 03/13/2018  5:07 AM  SpO2 100 % 03/13/2018  5:07 AM  Vitals shown include unvalidated device data.  Last Pain:  Vitals:   03/13/18 0131  TempSrc:   PainSc: 10-Worst pain ever         Complications: No apparent anesthesia complications

## 2018-03-13 NOTE — ED Provider Notes (Signed)
I assumed care of this patient from Dr. Adela Lank at 0000.  Please see their note for further details of Hx, PE.  Briefly patient is a 38 y.o. male who presented with back pain with numbness and +clonus concerning for cord compression awaiting MRI.   MRI notable for epidural and right rib abscesses.  Patient initially declined IV drug use to Dr. Adela Lank however he admitted to recent IV drug use to me.  Will discuss case with neurosurgery and start patient on empiric antibiotics.  I spoke with Dr. Dutch Quint who will take the patient to the OR to drain.  He requested holding antibiotics at this time until cultures from the abscess were obtained.  CRITICAL CARE Performed by: Amadeo Garnet Teisha Trowbridge Total critical care time: 30 minutes Critical care time was exclusive of separately billable procedures and treating other patients. Critical care was necessary to treat or prevent imminent or life-threatening deterioration. Critical care was time spent personally by me on the following activities: development of treatment plan with patient and/or surrogate as well as nursing, discussions with consultants, evaluation of patient's response to treatment, examination of patient, obtaining history from patient or surrogate, ordering and performing treatments and interventions, ordering and review of laboratory studies, ordering and review of radiographic studies, pulse oximetry and re-evaluation of patient's condition.     Nira Conn, MD 03/13/18 [phone]

## 2018-03-13 NOTE — Plan of Care (Signed)
  Problem: Pain Managment: Goal: General experience of comfort will improve Outcome: Progressing   Problem: Safety: Goal: Ability to remain free from injury will improve Outcome: Progressing   Problem: Skin Integrity: Goal: Risk for impaired skin integrity will decrease Outcome: Progressing   

## 2018-03-13 NOTE — H&P (Signed)
James [name] is an 38 y.o. male.   Chief Complaint: Numbness HPI: 38 year old male with history of IV drug abuse presents with back pain and increasing numbness and some weakness in the both lower extremities.  No fever.  No chills.  No other areas of pain or problem.  Symptoms been gradually progressing.  Difficulty ambulating.  No incontinence.  Past Medical History:  Diagnosis Date  . Hypertension     History reviewed. No pertinent surgical history.  No family history on file. Social History:  has no history on file for tobacco, alcohol, and drug.  Allergies: No Known Allergies  (Not in a hospital admission)   Results for orders placed or performed during the hospital encounter of 03/12/18 (from the past 48 hour(s))  CBC with Differential     Status: Abnormal   Collection Time: 03/12/18  9:48 PM  Result Value Ref Range   WBC 8.4 4.0 - 10.5 K/uL   RBC 3.87 (L) 4.22 - 5.81 MIL/uL   Hemoglobin 10.5 (L) 13.0 - 17.0 g/dL   HCT 43.5 (L) 68.6 - 16.8 %   MCV 86.6 80.0 - 100.0 fL   MCH 27.1 26.0 - 34.0 pg   MCHC 31.3 30.0 - 36.0 g/dL   RDW 37.2 90.2 - 11.1 %   Platelets 329 150 - 400 K/uL   nRBC 0.0 0.0 - 0.2 %   Neutrophils Relative % 69 %   Neutro Abs 5.9 1.7 - 7.7 K/uL   Lymphocytes Relative 18 %   Lymphs Abs 1.5 0.7 - 4.0 K/uL   Monocytes Relative 11 %   Monocytes Absolute 0.9 0.1 - 1.0 K/uL   Eosinophils Relative 1 %   Eosinophils Absolute 0.1 0.0 - 0.5 K/uL   Basophils Relative 0 %   Basophils Absolute 0.0 0.0 - 0.1 K/uL   Immature Granulocytes 1 %   Abs Immature Granulocytes 0.07 0.00 - 0.07 K/uL    Comment: Performed at [facility], [address], [place], [place] [address]  Comprehensive metabolic panel     Status: Abnormal   Collection Time: 03/12/18  9:48 PM  Result Value Ref Range   Sodium 133 (L) 135 - 145 mmol/L   Potassium 3.9 3.5 - 5.1 mmol/L   Chloride 99 98 - 111 mmol/L   CO2 24 22 - 32 mmol/L   Glucose, Bld 117 (H) 70 - 99 mg/dL   BUN 7 6  - 20 mg/dL   Creatinine, Ser 0.22 0.61 - 1.24 mg/dL   Calcium 8.7 (L) 8.9 - 10.3 mg/dL   Total Protein 7.2 6.5 - 8.1 g/dL   Albumin 2.9 (L) 3.5 - 5.0 g/dL   AST 28 15 - 41 U/L   ALT 45 (H) 0 - 44 U/L   Alkaline Phosphatase 125 38 - 126 U/L   Total Bilirubin 0.4 0.3 - 1.2 mg/dL   GFR calc non Af Amer >60 >60 mL/min   GFR calc Af Amer >60 >60 mL/min   Anion gap 10 5 - 15    Comment: Performed at [facility], [address] [place], [place] [address]   Mr Cervical Spine W Or Wo Contrast  Result Date: 03/13/2018 CLINICAL DATA:  Severe pain, numbness below the waist since March 10, 2018. Assess for cauda equina syndrome. EXAM: MRI CERVICAL AND THORACIC SPINE WITHOUT AND WITH CONTRAST TECHNIQUE: Multiplanar and multiecho pulse sequences of the cervical spine, to include the craniocervical junction and cervicothoracic junction, and thoracic spine, were obtained  without and with intravenous contrast. CONTRAST:  10 cc Gadavist COMPARISON:  None. FINDINGS: MRI CERVICAL SPINE FINDINGS-moderately motion degraded examination. ALIGNMENT: Straightened cervical lordosis.  No malalignment. VERTEBRAE/DISCS: Vertebral bodies are intact. Intervertebral disc morphology's and signal are normal. CORD:Cervical spinal cord is normal morphology and signal characteristics from the cervicomedullary junction to level of T2-3, the most caudal well visualized level. POSTERIOR FOSSA, VERTEBRAL ARTERIES, PARASPINAL TISSUES: No MR findings of ligamentous injury. Vertebral artery flow voids present. Included posterior fossa and paraspinal soft tissues are normal. DISC LEVELS: C2-3: Large RIGHT subarticular disc protrusion. No canal stenosis. Severe RIGHT neural foraminal narrowing. C3-4: Annular bulging eccentric laterally. No canal stenosis. Moderate RIGHT neural foraminal narrowing. C4-5: Small LEFT central disc protrusion without canal stenosis. Mild suspected LEFT neural foraminal narrowing. C5-6: Moderate LEFT subarticular  disc protrusion. No canal stenosis. Severe LEFT neural foraminal narrowing. C6-7: Annular bulging asymmetric to LEFT. No canal stenosis. Mild LEFT neural foraminal narrowing. C7-T1: No disc bulge, canal stenosis nor neural foraminal narrowing. MRI THORACIC SPINE FINDINGS ALIGNMENT: Maintenance of the thoracic kyphosis. No malalignment. VERTEBRAE/DISCS: Vertebral bodies are intact. Multilevel mild disc desiccation and chronic discogenic endplate changes. Bright STIR signal and enhancement within the posterior elements of T3. No abnormal disc enhancement. Enhancing bright STIR signal LEFT T8-9, RIGHT T9-10 and LEFT T10-11 facet with masslike appearance. RIGHT third rib 2.8 x 2.5 cm enhancing mass. CORD: Dorsal epidural phlegmon from T1 through T4-5, 8 x 10 x 34 mm (AP by transverse by cc) dorsal epidural abscess from T2-3 through T4-5. Cord compression from T3 through T4 with mild cord edema at this level. No syrinx. PREVERTEBRAL AND PARASPINAL SOFT TISSUES: Interstitial STIR signal and enhancement within the interspinous space and paraspinal muscles at T1-2 through T4-5 consistent with infection. DISC LEVELS: Small broad-based disc bulge at T6-7. Moderate LEFT central to subarticular disc protrusion T8-9. IMPRESSION: MRI cervical spine: 1. Moderately motion degraded examination. No acute osseous process. 2. No canal stenosis. Neural foraminal narrowing C2-3 through C6-7: Severe on the RIGHT at C2-3 and severe on the LEFT at C5-6 comment; this may be overestimated by motion artifact. MRI thoracic spine: 1. Dorsal epidural phlegmon T1 through T4-5 with 8 x 10 x 34 mm epidural abscess from T2-3 through T4-5. Resultant severe cord compression and cord edema. 2. LEFT T8-9, RIGHT T9-10 and LEFT T10-11 facet septic arthritis. Associated paraspinal myositis. 3. 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 Consider CT chest with contrast. Critical Value/emergent results were  called by telephone at the time of interpretation on 03/13/2018 at 12:59 am to Dr. Melene Plan , who verbally acknowledged these results. Electronically Signed   By: Awilda Metro M.D.   On: 03/13/2018 01:00   Mr Thoracic Spine W Wo Contrast  Result Date: 03/13/2018 CLINICAL DATA:  Severe pain, numbness below the waist since March 10, 2018. Assess for cauda equina syndrome. EXAM: MRI CERVICAL AND THORACIC SPINE WITHOUT AND WITH CONTRAST TECHNIQUE: Multiplanar and multiecho pulse sequences of the cervical spine, to include the craniocervical junction and cervicothoracic junction, and thoracic spine, were obtained without and with intravenous contrast. CONTRAST:  10 cc Gadavist COMPARISON:  None. FINDINGS: MRI CERVICAL SPINE FINDINGS-moderately motion degraded examination. ALIGNMENT: Straightened cervical lordosis.  No malalignment. VERTEBRAE/DISCS: Vertebral bodies are intact. Intervertebral disc morphology's and signal are normal. CORD:Cervical spinal cord is normal morphology and signal characteristics from the cervicomedullary junction to level of T2-3, the most caudal well visualized level. POSTERIOR FOSSA, VERTEBRAL ARTERIES, PARASPINAL TISSUES:  No MR findings of ligamentous injury. Vertebral artery flow voids present. Included posterior fossa and paraspinal soft tissues are normal. DISC LEVELS: C2-3: Large RIGHT subarticular disc protrusion. No canal stenosis. Severe RIGHT neural foraminal narrowing. C3-4: Annular bulging eccentric laterally. No canal stenosis. Moderate RIGHT neural foraminal narrowing. C4-5: Small LEFT central disc protrusion without canal stenosis. Mild suspected LEFT neural foraminal narrowing. C5-6: Moderate LEFT subarticular disc protrusion. No canal stenosis. Severe LEFT neural foraminal narrowing. C6-7: Annular bulging asymmetric to LEFT. No canal stenosis. Mild LEFT neural foraminal narrowing. C7-T1: No disc bulge, canal stenosis nor neural foraminal narrowing. MRI THORACIC SPINE  FINDINGS ALIGNMENT: Maintenance of the thoracic kyphosis. No malalignment. VERTEBRAE/DISCS: Vertebral bodies are intact. Multilevel mild disc desiccation and chronic discogenic endplate changes. Bright STIR signal and enhancement within the posterior elements of T3. No abnormal disc enhancement. Enhancing bright STIR signal LEFT T8-9, RIGHT T9-10 and LEFT T10-11 facet with masslike appearance. RIGHT third rib 2.8 x 2.5 cm enhancing mass. CORD: Dorsal epidural phlegmon from T1 through T4-5, 8 x 10 x 34 mm (AP by transverse by cc) dorsal epidural abscess from T2-3 through T4-5. Cord compression from T3 through T4 with mild cord edema at this level. No syrinx. PREVERTEBRAL AND PARASPINAL SOFT TISSUES: Interstitial STIR signal and enhancement within the interspinous space and paraspinal muscles at T1-2 through T4-5 consistent with infection. DISC LEVELS: Small broad-based disc bulge at T6-7. Moderate LEFT central to subarticular disc protrusion T8-9. IMPRESSION: MRI cervical spine: 1. Moderately motion degraded examination. No acute osseous process. 2. No canal stenosis. Neural foraminal narrowing C2-3 through C6-7: Severe on the RIGHT at C2-3 and severe on the LEFT at C5-6 comment; this may be overestimated by motion artifact. MRI thoracic spine: 1. Dorsal epidural phlegmon T1 through T4-5 with 8 x 10 x 34 mm epidural abscess from T2-3 through T4-5. Resultant severe cord compression and cord edema. 2. LEFT T8-9, RIGHT T9-10 and LEFT T10-11 facet septic arthritis. Associated paraspinal myositis. 3. 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 Consider CT chest with contrast. Critical Value/emergent results were called by telephone at the time of interpretation on 03/13/2018 at 12:59 am to Dr. Melene Plan , who verbally acknowledged these results. Electronically Signed   By: Awilda Metro M.D.   On: 03/13/2018 01:00    Pertinent items noted in HPI and remainder of  comprehensive ROS otherwise negative.  Blood pressure 117/67, pulse 96, temperature 98.3 F (36.8 C), temperature source Oral, resp. rate 18, height  (1.753 m), weight 113.4 kg, SpO2 100 %.  Patient is awake and alert.  He is oriented and appropriate.  Speech is fluent.  Judgment and insight appear intact.  Cranial nerve function normal bilaterally.  Motor examination upper extremities 5/5.  Motor examination of lower extremities 4+/5 with increased tone.  Sensory examination with some partial sensory level in his upper thoracic region.  Deep tender if is normal active in both upper extremities.  Increased reflexes in both lower extremities.  Positive clonus.  Toes upgoing to plantar stimulation.  Examination head ears eyes nose throat is unremarked.  Chest and abdomen are benign.  Extremities are free from injury or deformity. 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  Given the degree of mass-effect and the patient's current symptoms I think he is at high risk of spinal cord injury if treated with antibiotics alone.  I recommend that  we move to the operating room for T3-T4 decompressive laminectomy with evacuation of epidural abscess.  I discussed the risks involved with surgery including but not limited to the risk of anesthesia, bleeding, infection, CSF leak, nerve root injury, spinal cord injury, later instability, continued pain, and non-benefit.  The patient also with evidence of osteomyelitis in his lower thoracic spine without evidence of epidural abscess or stenosis.  Plan for infectious disease consult and further work-up after surgery has been performed.  Sherilyn CooterHenry A Keyairra [name] 03/13/2018, 2:11 AM

## 2018-03-13 NOTE — Brief Op Note (Signed)
03/13/2018  4:51 AM  PATIENT:  James [name]  38 y.o. male  PRE-OPERATIVE DIAGNOSIS:  Epidural Abscess  POST-OPERATIVE DIAGNOSIS:  Epidural Abscess  PROCEDURE:  Procedure(s): THORACIC LAMINECTOMY FOR EPIDURAL ABSCESS (N/A)  SURGEON:  Surgeon(s) and Role:    * Julio Sicks, MD - Primary  PHYSICIAN ASSISTANT:   ASSISTANTS:    ANESTHESIA:   general  EBL:  300 mL   BLOOD ADMINISTERED:none  DRAINS: (med) Hemovact drain(s) in the epidural space with  Suction Open   LOCAL MEDICATIONS USED:  MARCAINE     SPECIMEN:  No Specimen  DISPOSITION OF SPECIMEN:  NA  COUNTS:  YES  TOURNIQUET:  * No tourniquets in log *  DICTATION: .Dragon Dictation  PLAN OF CARE: Admit to inpatient   PATIENT DISPOSITION:  PACU - hemodynamically stable.   Delay start of Pharmacological VTE agent (>24hrs) due to surgical blood loss or risk of bleeding: yes

## 2018-03-13 NOTE — Anesthesia Preprocedure Evaluation (Addendum)
Anesthesia Evaluation  Patient identified by MRN, date of birth, ID band Patient awake    Reviewed: Allergy & Precautions, NPO status , Patient's Chart, lab work & pertinent test results  Airway Mallampati: III  TM Distance: >3 FB Neck ROM: Full    Dental no notable dental hx.    Pulmonary neg pulmonary ROS,    Pulmonary exam normal breath sounds clear to auscultation       Cardiovascular hypertension, Pt. 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 with significant spinal cord compression negative psych ROS   GI/Hepatic negative GI ROS, (+)     substance abuse  IV drug use,   Endo/Other  negative endocrine ROS  Renal/GU negative Renal ROS     Musculoskeletal negative musculoskeletal ROS (+)   Abdominal (+) + obese,   Peds  Hematology  (+) anemia ,   Anesthesia Other Findings Epidural Abscess  Reproductive/Obstetrics                            Anesthesia Physical Anesthesia Plan  ASA: III and emergent  Anesthesia Plan: General   Post-op Pain Management:    Induction: Intravenous  PONV Risk Score and Plan: 3 and Ondansetron, Midazolam and Treatment may vary due to age or medical condition  Airway Management Planned: Oral ETT  Additional Equipment:   Intra-op Plan:   Post-operative Plan: Extubation in OR  Informed Consent: I have reviewed the patients History and Physical, chart, labs and discussed the procedure including the risks, benefits and alternatives for the proposed anesthesia with the patient or authorized representative who has indicated his/her understanding and acceptance.     Dental advisory given  Plan Discussed with: CRNA  Anesthesia Plan Comments:         Anesthesia Quick Evaluation

## 2018-03-13 NOTE — Progress Notes (Signed)
Per ID MD, hold PICC until [facility] resulted

## 2018-03-13 NOTE — Progress Notes (Addendum)
Pharmacy Antibiotic Note  James [name] is a 38 y.o. male admitted on 03/12/2018 with epidural abscess, septic arthritis, osteomyelitis. Pt is now post-op.  Pharmacy has been consulted for Vancomycin dosing. WBC WNL. Renal function good.   Plan: -Vancomycin 1500 mg IV q12h >>Estimated AUC 481 -Ceftriaxone per MD  -Trend WBC, temp, renal function  -F/U infectious work-up -Drug levels as indicated   Height: 5\' 9"  (175.3 cm) Weight: 250 lb (113.4 kg) IBW/kg (Calculated) : 70.7  Temp (24hrs), Avg:97.9 F (36.6 C), Min:97.3 F (36.3 C), Max:98.5 F (36.9 C)  Recent Labs  Lab 03/12/18 2148  WBC 8.4  CREATININE 0.80    Estimated Creatinine Clearance: 158.5 mL/min (by C-G formula based on SCr of 0.8 mg/dL).    No Known Allergies    Abran Duke 03/13/2018 6:25 AM

## 2018-03-13 NOTE — Anesthesia Procedure Notes (Signed)
Procedure Name: Intubation Date/Time: 03/13/2018 3:29 AM Performed by: Claudina Lick, CRNA Pre-anesthesia Checklist: Patient identified, Emergency Drugs available, Suction available, Patient being monitored and Timeout performed Patient Re-evaluated:Patient Re-evaluated prior to induction Oxygen Delivery Method: Circle system utilized Preoxygenation: Pre-oxygenation with 100% oxygen Induction Type: IV induction and Rapid sequence Laryngoscope Size: Miller and 2 Grade View: Grade I Tube type: Oral Tube size: 7.5 mm Number of attempts: 1 Airway Equipment and Method: Stylet Placement Confirmation: ETT inserted through vocal cords under direct vision,  positive ETCO2 and breath sounds checked- equal and bilateral Secured at: 23 cm Tube secured with: Tape Dental Injury: Teeth and Oropharynx as per pre-operative assessment

## 2018-03-14 ENCOUNTER — Encounter (HOSPITAL_COMMUNITY): Payer: Self-pay | Admitting: Neurosurgery

## 2018-03-14 DIAGNOSIS — G629 Polyneuropathy, unspecified: Secondary | ICD-10-CM

## 2018-03-14 DIAGNOSIS — I10 Essential (primary) hypertension: Secondary | ICD-10-CM | POA: Diagnosis present

## 2018-03-14 DIAGNOSIS — B954 Other streptococcus as the cause of diseases classified elsewhere: Secondary | ICD-10-CM

## 2018-03-14 DIAGNOSIS — F1990 Other psychoactive substance use, unspecified, uncomplicated: Secondary | ICD-10-CM

## 2018-03-14 LAB — HIV ANTIBODY (ROUTINE TESTING W REFLEX): HIV Screen 4th Generation wRfx: NONREACTIVE

## 2018-03-14 MED ORDER — IBUPROFEN 400 MG PO TABS
400.0000 mg | ORAL_TABLET | Freq: Three times a day (TID) | ORAL | Status: DC
  Administered 2018-03-14 (×2): 400 mg via ORAL
  Filled 2018-03-14 (×2): qty 1

## 2018-03-14 MED ORDER — SODIUM CHLORIDE 0.9 % IV SOLN
1.0000 g | Freq: Two times a day (BID) | INTRAVENOUS | Status: DC
  Administered 2018-03-14 – 2018-03-15 (×3): 1 g via INTRAVENOUS
  Filled 2018-03-14 (×3): qty 10

## 2018-03-14 MED ORDER — GABAPENTIN 100 MG PO CAPS
100.0000 mg | ORAL_CAPSULE | Freq: Three times a day (TID) | ORAL | Status: DC
  Administered 2018-03-14 (×2): 100 mg via ORAL
  Filled 2018-03-14 (×2): qty 1

## 2018-03-14 NOTE — Progress Notes (Signed)
[facility]  Date of Admission:  03/12/2018     Total days of antibiotics 1         ASSESSMENT/PLAN  James [name] has an epidural abscess, epidural intraspinous abscess and right 3rd rib osteomyelitis/abscess s/p decompressive laminectomy and abscess evacuation. Symptoms appear to be improving slowly with residual neuropathy present. Surgical cultures remain pending and blood cultures without growth to date. TTE reviewed with no vegetations present. Plan to continue current broad spectrum antibiotics with vancomycin and ceftriaxone.  Please do not place a PICC line until blood cultures have been cleared.  1. Continue current dose of vancomycin and ceftriaxone. 2. Monitor cultures and fevers. 3. Monitor vancomycin trough and renal function while on vancomycin. 4. Hold on PICC line until blood cultures cleared.   Active Problems:   Epidural abscess   Epidural intraspinal abscess   . lisinopril  10 mg Oral QHS  . sodium chloride flush  3 mL Intravenous Q12H    SUBJECTIVE:  Mildly elevated temperature of 100.1 this morning. No acute events overnight. Blood cultures with no growth to date. Surgical specimen with no organism on gram stain and cultures pending. TTE performed with no evidence of vegetation.   Increased back pain today with continued neuropathy. Has been up and moving around.  No Known Allergies   Review of Systems: Review of Systems  Constitutional: Negative for chills, fever and weight loss.  Respiratory: Negative for cough, shortness of breath and wheezing.   Cardiovascular: Negative for chest pain and leg swelling.  Gastrointestinal: Negative for abdominal pain, constipation, diarrhea, nausea and vomiting.  Musculoskeletal: Positive for back pain.  Skin: Negative for rash.      OBJECTIVE: Vitals:   03/13/18 2143 03/14/18 0217 03/14/18 0512 03/14/18 0935  BP: 103/61 106/64 115/64 (!) 115/56  Pulse: (!) 104 94 93 95  Resp: 19 18  18    Temp: 99 F (37.2 C) 99.5 F (37.5 C) 100.1 F (37.8 C) 98.3 F (36.8 C)  TempSrc: Oral Oral Oral   SpO2: 100% 100% 100% 100%  Weight:      Height:       Body mass index is 36.92 kg/m.  Physical Exam Constitutional:      General: He is not in acute distress.    Appearance: He is well-developed.  Cardiovascular:     Rate and Rhythm: Normal rate and regular rhythm.     Heart sounds: Normal heart sounds.  Pulmonary:     Effort: Pulmonary effort is normal.     Breath sounds: Normal breath sounds.  Skin:    General: Skin is warm and dry.  Neurological:     Mental Status: He is alert and oriented to person, place, and time.  Psychiatric:        Behavior: Behavior normal.        Thought Content: Thought content normal.        Judgment: Judgment normal.     Lab Results Lab Results  Component Value Date   WBC 8.4 03/12/2018   HGB 10.5 (L) 03/12/2018   HCT 33.5 (L) 03/12/2018   MCV 86.6 03/12/2018   PLT 329 03/12/2018    Lab Results  Component Value Date   CREATININE 0.87 03/13/2018   BUN 7 03/13/2018   NA 133 (L) 03/13/2018   K 3.5 03/13/2018   CL 99 03/13/2018   CO2 25 03/13/2018    Lab Results  Component Value Date   ALT 45 (H) 03/12/2018  AST 28 03/12/2018   ALKPHOS 125 03/12/2018   BILITOT 0.4 03/12/2018     Microbiology: Recent Results (from the past 240 hour(s))  Blood culture (routine x 2)     Status: None (Preliminary result)   Collection Time: 03/13/18  2:26 AM  Result Value Ref Range Status   Specimen Description BLOOD RIGHT ANTECUBITAL  Final   Special Requests   Final    BOTTLES DRAWN AEROBIC AND ANAEROBIC Blood Culture adequate volume   Culture   Final    NO GROWTH 1 DAY Performed at [facility], [address] [place], [place] [address]    Report Status PENDING  Incomplete  Blood culture (routine x 2)     Status: None (Preliminary result)   Collection Time: 03/13/18  2:27 AM  Result Value Ref Range Status   Specimen  Description BLOOD RIGHT HAND  Final   Special Requests   Final    BOTTLES DRAWN AEROBIC ONLY Blood Culture results may not be optimal due to an excessive volume of blood received in culture bottles   Culture   Final    NO GROWTH 1 DAY Performed at [facility], [address] [place], [place] [address]    Report Status PENDING  Incomplete  Gram stain     Status: None   Collection Time: 03/13/18  3:43 AM  Result Value Ref Range Status   Specimen Description ABSCESS  Final   Special Requests THORACIC EPIDURAL AEROBIC SWAB ONLY PER DR POOL  Final   Gram Stain   Final    FEW WBC PRESENT, PREDOMINANTLY PMN NO ORGANISMS SEEN Performed at [facility], [address] [place], [place] [address]    Report Status 03/13/2018 FINAL  Final     Marcos Eke, NP [facility]   03/14/2018  9:44 AM

## 2018-03-14 NOTE — Consult Note (Signed)
[facility] Medical Consultation  James [name] KPV:[id number] DOB: 1980/03/07 DOA: 03/12/2018 PCP: Patient, No Pcp Per   Requesting physician: Julio Sicks MD Date of consultation: 03/14/2018 Reason for consultation: Medical problems including hypertension and drug abuse.  Impression/Recommendations Active Problems:   Epidural abscess   Epidural intraspinal abscess   IVDU (intravenous drug user)   Essential hypertension    1.  Essential hypertension: Chronic medical problem.  Patient used to be on lisinopril and was not taking recently.  Lisinopril 10 mg was resumed.  His blood pressures are stable.  Continue same doses for now.  2.  Epidural abscess on a patient with IV drug use: Status post emergent evacuation.  Pus culture and blood cultures are pending.  2D echocardiogram with no evidence of vegetation.  Currently remains on vancomycin and Rocephin.  Followed by ID.  Continue current doses until final cultures.  3.  IV drug use: Patient is stated intermittent use of heroin.  Last use 3 days ago.  Patient stated that he goes clean for 1 to 2 weeks without withdrawals. Patient currently immediate postop, needing IV and oral opiates.  We discussed about cessation of IV drug use. Patient is motivated and agreeable. We will start patient on gabapentin 100 mg 3 times a day and gradually increase the doses. Will start scheduled doses of ibuprofen in hopes of reducing the need for oxycodone use. Patient will be started on Flexeril after his Valium doses are over.  I will followup again tomorrow. Please contact me if I can be of assistance in the meanwhile. Thank you for this consultation.  Chief Complaint: Back pain.  HPI:  38 year old gentleman with history of hypertension intermittently on lisinopril, chronic back pain issues, stated he used to be on tramadol and Flexeril.  For last 5 years he will use pain medications, and he will use heroin whenever he does not have any opiate  prescriptions.  He does have back pain issues however since last Saturday, he had increasing back pain, lower extremity weakness so he came to ER.  In the emergency room, he was found to have thoracic epidural abscess, epidural intraspinal abscess and right third rib osteomyelitis.  He underwent emergent decompressive laminectomy and abscess evacuation.  Blood cultures are pending.  Blood cultures are pending.  He has been started on vancomycin and Rocephin.  Surgically improving as appropriate.  Medicine is consulted because of history of hypertension and drug abuse. Patient was interviewed at the bedside.  He does complain of back pain currently needing some pain medications.  He looks comfortable.  Otherwise hemodynamically stable. We discussed in detail about drug abuse, patient is very motivated to quit drug use.  He wants to get something for his back pain and does not want to use heroin.  We discussed that we will gradually taper him off the narcotics in the hospital that is used for postop pain.  He is interested to be on gabapentin and Flexeril.  Review of Systems:  Currently has back pain.  His lower extremity weakness has improved and he was able to walk independently without dragging his feet.  Past Medical History:  Diagnosis Date  . Hypertension    Past Surgical History:  Procedure Laterality Date  . THORACIC LAMINECTOMY FOR EPIDURAL ABSCESS N/A 03/13/2018   Procedure: THORACIC LAMINECTOMY FOR EPIDURAL ABSCESS;  Surgeon: Julio Sicks, MD;  Location: [facility] OR;  Service: Neurosurgery;  Laterality: N/A;   Social History:  has no history on file for tobacco, alcohol,  and drug.  No Known Allergies History reviewed. No pertinent family history.  Prior to Admission medications   Medication Sig Start Date End Date Taking? Authorizing Provider  cyclobenzaprine (FLEXERIL) 10 MG tablet Take 10 mg by mouth 2 (two) times daily as needed for muscle spasms.  01/02/18  Yes [provider]   ibuprofen (ADVIL,MOTRIN) 200 MG tablet Take 800 mg by mouth every 4 (four) hours as needed (back pain).   Yes [provider]  lisinopril (PRINIVIL,ZESTRIL) 10 MG tablet Take 10 mg by mouth at bedtime. 01/02/18  Yes [provider]   Physical Exam: Blood pressure (!) 115/56, pulse 95, temperature 98.3 F (36.8 C), resp. rate 18, height  (1.753 m), weight 113.4 kg, SpO2 100 %. Vitals:   03/14/18 0512 03/14/18 0935  BP: 115/64 (!) 115/56  Pulse: 93 95  Resp: 18   Temp: 100.1 F (37.8 C) 98.3 F (36.8 C)  SpO2: 100% 100%     General: Well-built.  Not in any distress.  Eyes: PERRLA, no drainage.  ENT: No oropharyngeal lesions.  No thrush.  Mucous membranes are moist.  Neck: Supple, nontender, no lymphadenopathy  Cardiovascular: S1-S2 normal.  No murmurs.  Respiratory: Bilateral equal.  No wheezing or crepitation.  Abdomen: Soft nontender bowel sounds present.  Obese and pendulous.  Skin: No lesions or ulcers.  Postop surgical dressing with JP drain on the back.  Musculoskeletal: No deformities.  No joint swelling.  Psychiatric: Alert oriented x3.  Normal mood and affect.  Denies any suicidal homicidal ideations.  Neurologic: Alert oriented x3.  Cranial nerves II to XII normal.  Motor and sensory examination normal.  Patient walked around the hallway without gait disturbances.  Labs on Admission:  Basic Metabolic Panel: Recent Labs  Lab 03/12/18 2148 03/13/18 0835  NA 133* 133*  K 3.9 3.5  CL 99 99  CO2 24 25  GLUCOSE 117* 135*  BUN 7 7  CREATININE 0.80 0.87  CALCIUM 8.7* 8.4*   Liver Function Tests: Recent Labs  Lab 03/12/18 2148  AST 28  ALT 45*  ALKPHOS 125  BILITOT 0.4  PROT 7.2  ALBUMIN 2.9*   No results for input(s): LIPASE, AMYLASE in the last 168 hours. No results for input(s): AMMONIA in the last 168 hours. CBC: Recent Labs  Lab 03/12/18 2148  WBC 8.4  NEUTROABS 5.9  HGB 10.5*  HCT 33.5*  MCV 86.6  PLT 329    Cardiac Enzymes: No results for input(s): CKTOTAL, CKMB, CKMBINDEX, TROPONINI in the last 168 hours. BNP: Invalid input(s): POCBNP CBG: No results for input(s): GLUCAP in the last 168 hours.  Radiological Exams on Admission: Mr Cervical Spine W Or Wo Contrast  Result Date: 03/13/2018 CLINICAL DATA:  Severe pain, numbness below the waist since March 10, 2018. Assess for cauda equina syndrome. EXAM: MRI CERVICAL AND THORACIC SPINE WITHOUT AND WITH CONTRAST TECHNIQUE: Multiplanar and multiecho pulse sequences of the cervical spine, to include the craniocervical junction and cervicothoracic junction, and thoracic spine, were obtained without and with intravenous contrast. CONTRAST:  10 cc Gadavist COMPARISON:  None. FINDINGS: MRI CERVICAL SPINE FINDINGS-moderately motion degraded examination. ALIGNMENT: Straightened cervical lordosis.  No malalignment. VERTEBRAE/DISCS: Vertebral bodies are intact. Intervertebral disc morphology's and signal are normal. CORD:Cervical spinal cord is normal morphology and signal characteristics from the cervicomedullary junction to level of T2-3, the most caudal well visualized level. POSTERIOR FOSSA, VERTEBRAL ARTERIES, PARASPINAL TISSUES: No MR findings of ligamentous injury. Vertebral artery flow voids present. Included posterior fossa  and paraspinal soft tissues are normal. DISC LEVELS: C2-3: Large RIGHT subarticular disc protrusion. No canal stenosis. Severe RIGHT neural foraminal narrowing. C3-4: Annular bulging eccentric laterally. No canal stenosis. Moderate RIGHT neural foraminal narrowing. C4-5: Small LEFT central disc protrusion without canal stenosis. Mild suspected LEFT neural foraminal narrowing. C5-6: Moderate LEFT subarticular disc protrusion. No canal stenosis. Severe LEFT neural foraminal narrowing. C6-7: Annular bulging asymmetric to LEFT. No canal stenosis. Mild LEFT neural foraminal narrowing. C7-T1: No disc bulge, canal stenosis nor neural foraminal  narrowing. MRI THORACIC SPINE FINDINGS ALIGNMENT: Maintenance of the thoracic kyphosis. No malalignment. VERTEBRAE/DISCS: Vertebral bodies are intact. Multilevel mild disc desiccation and chronic discogenic endplate changes. Bright STIR signal and enhancement within the posterior elements of T3. No abnormal disc enhancement. Enhancing bright STIR signal LEFT T8-9, RIGHT T9-10 and LEFT T10-11 facet with masslike appearance. RIGHT third rib 2.8 x 2.5 cm enhancing mass. CORD: Dorsal epidural phlegmon from T1 through T4-5, 8 x 10 x 34 mm (AP by transverse by cc) dorsal epidural abscess from T2-3 through T4-5. Cord compression from T3 through T4 with mild cord edema at this level. No syrinx. PREVERTEBRAL AND PARASPINAL SOFT TISSUES: Interstitial STIR signal and enhancement within the interspinous space and paraspinal muscles at T1-2 through T4-5 consistent with infection. DISC LEVELS: Small broad-based disc bulge at T6-7. Moderate LEFT central to subarticular disc protrusion T8-9. IMPRESSION: MRI cervical spine: 1. Moderately motion degraded examination. No acute osseous process. 2. No canal stenosis. Neural foraminal narrowing C2-3 through C6-7: Severe on the RIGHT at C2-3 and severe on the LEFT at C5-6 comment; this may be overestimated by motion artifact. MRI thoracic spine: 1. Dorsal epidural phlegmon T1 through T4-5 with 8 x 10 x 34 mm epidural abscess from T2-3 through T4-5. Resultant severe cord compression and cord edema. 2. LEFT T8-9, RIGHT T9-10 and LEFT T10-11 facet septic arthritis. Associated paraspinal myositis. 3. 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 Consider CT chest with contrast. Critical Value/emergent results were called by telephone at the time of interpretation on 03/13/2018 at 12:59 am to Dr. Melene Plan , who verbally acknowledged these results. Electronically Signed   By: Awilda Metro M.D.   On: 03/13/2018 01:00   Mr Thoracic Spine W  Wo Contrast  Result Date: 03/13/2018 CLINICAL DATA:  Severe pain, numbness below the waist since March 10, 2018. Assess for cauda equina syndrome. EXAM: MRI CERVICAL AND THORACIC SPINE WITHOUT AND WITH CONTRAST TECHNIQUE: Multiplanar and multiecho pulse sequences of the cervical spine, to include the craniocervical junction and cervicothoracic junction, and thoracic spine, were obtained without and with intravenous contrast. CONTRAST:  10 cc Gadavist COMPARISON:  None. FINDINGS: MRI CERVICAL SPINE FINDINGS-moderately motion degraded examination. ALIGNMENT: Straightened cervical lordosis.  No malalignment. VERTEBRAE/DISCS: Vertebral bodies are intact. Intervertebral disc morphology's and signal are normal. CORD:Cervical spinal cord is normal morphology and signal characteristics from the cervicomedullary junction to level of T2-3, the most caudal well visualized level. POSTERIOR FOSSA, VERTEBRAL ARTERIES, PARASPINAL TISSUES: No MR findings of ligamentous injury. Vertebral artery flow voids present. Included posterior fossa and paraspinal soft tissues are normal. DISC LEVELS: C2-3: Large RIGHT subarticular disc protrusion. No canal stenosis. Severe RIGHT neural foraminal narrowing. C3-4: Annular bulging eccentric laterally. No canal stenosis. Moderate RIGHT neural foraminal narrowing. C4-5: Small LEFT central disc protrusion without canal stenosis. Mild suspected LEFT neural foraminal narrowing. C5-6: Moderate LEFT subarticular disc protrusion. No canal stenosis. Severe LEFT neural foraminal narrowing. C6-7: Annular bulging asymmetric to  LEFT. No canal stenosis. Mild LEFT neural foraminal narrowing. C7-T1: No disc bulge, canal stenosis nor neural foraminal narrowing. MRI THORACIC SPINE FINDINGS ALIGNMENT: Maintenance of the thoracic kyphosis. No malalignment. VERTEBRAE/DISCS: Vertebral bodies are intact. Multilevel mild disc desiccation and chronic discogenic endplate changes. Bright STIR signal and enhancement within  the posterior elements of T3. No abnormal disc enhancement. Enhancing bright STIR signal LEFT T8-9, RIGHT T9-10 and LEFT T10-11 facet with masslike appearance. RIGHT third rib 2.8 x 2.5 cm enhancing mass. CORD: Dorsal epidural phlegmon from T1 through T4-5, 8 x 10 x 34 mm (AP by transverse by cc) dorsal epidural abscess from T2-3 through T4-5. Cord compression from T3 through T4 with mild cord edema at this level. No syrinx. PREVERTEBRAL AND PARASPINAL SOFT TISSUES: Interstitial STIR signal and enhancement within the interspinous space and paraspinal muscles at T1-2 through T4-5 consistent with infection. DISC LEVELS: Small broad-based disc bulge at T6-7. Moderate LEFT central to subarticular disc protrusion T8-9. IMPRESSION: MRI cervical spine: 1. Moderately motion degraded examination. No acute osseous process. 2. No canal stenosis. Neural foraminal narrowing C2-3 through C6-7: Severe on the RIGHT at C2-3 and severe on the LEFT at C5-6 comment; this may be overestimated by motion artifact. MRI thoracic spine: 1. Dorsal epidural phlegmon T1 through T4-5 with 8 x 10 x 34 mm epidural abscess from T2-3 through T4-5. Resultant severe cord compression and cord edema. 2. LEFT T8-9, RIGHT T9-10 and LEFT T10-11 facet septic arthritis. Associated paraspinal myositis. 3. 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 Consider CT chest with contrast. Critical Value/emergent results were called by telephone at the time of interpretation on 03/13/2018 at 12:59 am to Dr. Melene Plan , who verbally acknowledged these results. Electronically Signed   By: Awilda Metro M.D.   On: 03/13/2018 01:00   [place] Ekg Site Rite  Result Date: 03/13/2018 If Site Rite image not attached, placement could not be confirmed due to current cardiac rhythm.   Echocardiogram: Reviewed.  Normal ejection fraction.  No gross abnormality or abscess.  Time spent: 45 minutes.  Dorcas Carrow [facility] Pager [phone]  If 7PM-7AM, please contact night-coverage [email or web address] Password [email or web address] 03/14/2018, 11:28 AM

## 2018-03-14 NOTE — Progress Notes (Signed)
Postop day 1.  Overall stable.  Motor and sensory function intact.  Cultures pending.  Continue IV antibiotics.

## 2018-03-14 NOTE — Evaluation (Signed)
Occupational Therapy Evaluation Patient Details Name: James [name] MRN: [id number] DOB: 07/25/1980 Today's Date: 03/14/2018    History of Present Illness 38 yo admitted with back pain and some progressive numbness and weakness bil LEs and UEs, difficulty walking; Now s/p Emergent T3-4 decompressive laminectomy with evacuation of epidural abscess, resection of epidural phlegmon, microdissection;  has a past medical history of Hypertension.and IV drug use   Clinical Impression   PTA pt was independent in ADLs and living with his parents. Currently patient performing ADLs at a Mod I level with increased time for pain. Patient in pain during session but tolerated and agreeable to OT session. Educated on back precautions and compensatory techniques including lower body ADLs and sleeping positions to decrease back pain and edema. Patient displayed understanding and self awareness of his injury during the session. Recommend dc home once medically stable per MD and and at this time no acute OT follow up is recommended.     Follow Up Recommendations  No OT follow up    Equipment Recommendations  None recommended by OT    Recommendations for Other Services       Precautions / Restrictions Precautions Precautions: Back(for comfort) Restrictions Weight Bearing Restrictions: No      Mobility Bed Mobility Overal bed mobility: Modified Independent             General bed mobility comments: Pt. EOB upon arrival and educated on log roll technique.  Transfers Overall transfer level: Independent                    Balance Overall balance assessment: Independent                                         ADL either performed or assessed with clinical judgement   ADL Overall ADL's : Modified independent Eating/Feeding: Independent   Grooming: Oral care;Modified independent Grooming Details (indicate cue type and reason): Pt. able to complete oral care with  Mod I, educated on using back precautions Upper Body Bathing: Modified independent Upper Body Bathing Details (indicate cue type and reason): increased time Lower Body Bathing: Modified independent Lower Body Bathing Details (indicate cue type and reason): educated on techniques to reduce pain and maintain back precations Upper Body Dressing : Modified independent Upper Body Dressing Details (indicate cue type and reason): increased time Lower Body Dressing: Modified independent Lower Body Dressing Details (indicate cue type and reason): increased time, educated on use of back precautions Toilet Transfer: Modified Independent   Toileting- Clothing Manipulation and Hygiene: Modified independent Toileting - Clothing Manipulation Details (indicate cue type and reason): increased time Tub/ Shower Transfer: Modified independent Web designer Details (indicate cue type and reason): increased time, educated on using precations to enter and exit shower. Functional mobility during ADLs: Modified independent General ADL Comments: Pt. able to participate in OT session with increased time due to back precautions. Pt. was educated on precautions to increase saefty and pain during ADLS.      Vision Baseline Vision/History: No visual deficits Patient Visual Report: No change from baseline       Perception     Praxis      Pertinent Vitals/Pain Pain Assessment: 0-10 Pain Score: 9  Pain Descriptors / Indicators: Constant;Throbbing Pain Intervention(s): Premedicated before session     Hand Dominance Right   Extremity/Trunk Assessment Upper Extremity Assessment Upper Extremity Assessment: Overall  WFL for tasks assessed   Lower Extremity Assessment Lower Extremity Assessment: Defer to PT evaluation   Cervical / Trunk Assessment Cervical / Trunk Assessment: Other exceptions Cervical / Trunk Exceptions: T3-4 decompressive laminectomy    Communication Communication Communication: No  difficulties   Cognition Arousal/Alertness: Awake/alert Behavior During Therapy: WFL for tasks assessed/performed Overall Cognitive Status: Within Functional Limits for tasks assessed                                     General Comments  Educated on back precautions to decrease pain and increase comfort.    Exercises     Shoulder Instructions      Home Living Family/patient expects to be discharged to:: Private residence Living Arrangements: Parent Available Help at Discharge: Family;Available 24 hours/day Type of Home: House Home Access: Stairs to enter [employer] of Steps: 4 Entrance Stairs-Rails: None Home Layout: Two level Alternate Level Stairs-Number of Steps: 15 Alternate Level Stairs-Rails: Left Bathroom Shower/Tub: Chief Strategy Officer: Standard                Prior Functioning/Environment Level of Independence: Independent                 OT Problem List: Decreased activity tolerance;Decreased knowledge of precautions;Pain;Impaired balance (sitting and/or standing)      OT Treatment/Interventions:      OT Goals(Current goals can be found in the care plan section) Acute Rehab OT Goals Patient Stated Goal: decrease pain OT Goal Formulation: All assessment and education complete, DC therapy  OT Frequency:     Barriers to D/C:            Co-evaluation              AM-PAC OT "6 Clicks" Daily Activity     Outcome Measure Help from another person eating meals?: None Help from another person taking care of personal grooming?: None Help from another person toileting, which includes using toliet, bedpan, or urinal?: None Help from another person bathing (including washing, rinsing, drying)?: None Help from another person to put on and taking off regular upper body clothing?: None Help from another person to put on and taking off regular lower body clothing?: None 6 Click Score: 24   End of Session     Activity Tolerance: Patient tolerated treatment well;Patient limited by pain Patient left: in chair;with call bell/phone within reach  OT Visit Diagnosis: Pain;Unsteadiness on feet (R26.81) Pain - part of body: (back)                Time: 1004-1015 OT Time Calculation (min): 11 min Charges:  OT General Charges $OT Visit: 1 Visit OT Evaluation $OT Eval Low Complexity: 1 Low  Terri Malerba MSOT, OTR/L Acute Rehab Pager: [phone] Office: [phone]   James [name] 03/14/2018, 10:40 AM

## 2018-03-15 ENCOUNTER — Encounter (HOSPITAL_COMMUNITY): Payer: Self-pay | Admitting: General Practice

## 2018-03-15 LAB — URINALYSIS, ROUTINE W REFLEX MICROSCOPIC
Bilirubin Urine: NEGATIVE
Glucose, UA: NEGATIVE mg/dL
Hgb urine dipstick: NEGATIVE
Ketones, ur: NEGATIVE mg/dL
Leukocytes,Ua: NEGATIVE
Nitrite: NEGATIVE
Protein, ur: NEGATIVE mg/dL
Specific Gravity, Urine: 1.015 (ref 1.005–1.030)
pH: 6 (ref 5.0–8.0)

## 2018-03-15 LAB — HEPATITIS C ANTIBODY

## 2018-03-15 MED ORDER — POLYETHYLENE GLYCOL 3350 17 G PO PACK
17.0000 g | PACK | Freq: Every day | ORAL | Status: DC
  Administered 2018-03-16 – 2018-04-08 (×15): 17 g via ORAL
  Filled 2018-03-15 (×23): qty 1

## 2018-03-15 MED ORDER — PANTOPRAZOLE SODIUM 40 MG PO TBEC
40.0000 mg | DELAYED_RELEASE_TABLET | Freq: Two times a day (BID) | ORAL | Status: DC
  Administered 2018-03-15 – 2018-04-09 (×51): 40 mg via ORAL
  Filled 2018-03-15 (×51): qty 1

## 2018-03-15 MED ORDER — SODIUM CHLORIDE 0.9% FLUSH
10.0000 mL | INTRAVENOUS | Status: DC | PRN
  Administered 2018-03-16: 10 mL
  Filled 2018-03-15: qty 40

## 2018-03-15 MED ORDER — SODIUM CHLORIDE 0.9 % IV SOLN
2.0000 g | INTRAVENOUS | Status: DC
  Administered 2018-03-15: 2 g via INTRAVENOUS
  Filled 2018-03-15 (×2): qty 20

## 2018-03-15 MED ORDER — OXYCODONE HCL 5 MG PO TABS
5.0000 mg | ORAL_TABLET | ORAL | Status: DC | PRN
  Administered 2018-03-15 – 2018-03-16 (×6): 5 mg via ORAL
  Filled 2018-03-15 (×6): qty 1

## 2018-03-15 MED ORDER — AMLODIPINE BESYLATE 5 MG PO TABS
5.0000 mg | ORAL_TABLET | Freq: Every day | ORAL | Status: DC
  Administered 2018-03-15 – 2018-04-09 (×26): 5 mg via ORAL
  Filled 2018-03-15 (×26): qty 1

## 2018-03-15 MED ORDER — GABAPENTIN 100 MG PO CAPS
200.0000 mg | ORAL_CAPSULE | Freq: Three times a day (TID) | ORAL | Status: DC
  Administered 2018-03-15 – 2018-04-09 (×77): 200 mg via ORAL
  Filled 2018-03-15 (×80): qty 2

## 2018-03-15 MED ORDER — OXYCODONE HCL 5 MG PO TABS: 5.0000 mg | ORAL_TABLET | ORAL | Status: DC | PRN

## 2018-03-15 MED ORDER — IBUPROFEN 600 MG PO TABS
600.0000 mg | ORAL_TABLET | Freq: Three times a day (TID) | ORAL | Status: DC
  Administered 2018-03-15 – 2018-04-09 (×77): 600 mg via ORAL
  Filled 2018-03-15 (×77): qty 1

## 2018-03-15 NOTE — Progress Notes (Signed)
PHARMACY NOTE:  ANTIMICROBIAL RENAL DOSAGE ADJUSTMENT  Current antimicrobial regimen includes a mismatch between antimicrobial dosage and estimated renal function.  As per policy approved by the Pharmacy & Therapeutics and Medical Executive Committees, the antimicrobial dosage will be adjusted accordingly.  Current antimicrobial dosage:  Ceftriaxone 1 gm Q 12 hours   Indication: Osteomyelitis  Renal Function:  Estimated Creatinine Clearance: 145.8 mL/min (by C-G formula based on SCr of 0.87 mg/dL). []      On intermittent HD, scheduled: []      On CRRT    Antimicrobial dosage has been changed to:  Ceftriaxone 2 gm every 24 hours  Additional comments: Adjusted dose to osteomyelitis dosing    Thank you for allowing pharmacy to be a part of this patient's care.  Sharin Mons, PharmD, BCPS, BCIDP Infectious Diseases Clinical Pharmacist Phone: [phone] 03/15/2018 11:00 AM

## 2018-03-15 NOTE — Plan of Care (Signed)
  Problem: Skin Integrity: Goal: Demonstration of wound healing without infection will improve Outcome: Progressing   Problem: Clinical Measurements: Goal: Will remain free from infection Outcome: Progressing Goal: Diagnostic test results will improve Outcome: Progressing   Problem: Coping: Goal: Level of anxiety will decrease Outcome: Progressing   Problem: Pain Managment: Goal: General experience of comfort will improve Outcome: Progressing   Problem: Skin Integrity: Goal: Risk for impaired skin integrity will decrease Outcome: Progressing   

## 2018-03-15 NOTE — Progress Notes (Signed)
[facility] PROGRESS NOTE    Progress Note  [name]  RAQ:[id number] DOB: 02/16/1980 DOA: 03/12/2018 PCP: Patient, No Pcp Per     Brief Narrative:   James [name] is an 38 y.o. 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 Discontinue lisinopril as we will be using ibuprofen. Start Norvasc 5.  Start on Protonix prophylaxis.  Epidural abscess/ Epidural intraspinal abscess Diagnosed post emergent surgical intervention. Currently on IV Rocephin and vancomycin. Infectious disease was consulted. 2D echo showed no evidence of vegetation.  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  Last use was 3 days prior to admission. It was discussed with the patient the need to stop using IV and oral opiates. And he is agreeable. Start on Neurontin 100 mg 3 times daily and will gradually increase the dose. Continue on ibuprofen and oxygen as needed. Titrating off oral narcotics. We will discontinue IV narcotics.    DVT prophylaxis: heparin  Family Communication:none Disposition Plan/Barrier to D/C: unable to determine Code Status:     Code Status Orders  (From admission, onward)         Start     Ordered   03/13/18 0611  Full code  Continuous     03/13/18 0610        Code Status History    This patient has a current code status but no historical code status.        IV Access:    Peripheral IV    Procedures and diagnostic studies:   No results found.   Medical Consultants:    None.  Anti-Infectives:   IV vancomycin and Rocephin  Subjective:    Skylan Cristain Galeno relates his pain is controlled.  Objective:    Vitals:   03/14/18 1356 03/14/18 2031 03/15/18 0123 03/15/18 0513  BP: 109/61 122/72 120/78 99/61  Pulse: 93 98 96 73  Resp:   19 20  Temp: 98.9 F (37.2 C) 97.6 F (36.4 C) 98 F (36.7 C) 98.4 F (36.9 C)  TempSrc: Oral Oral Oral Oral  SpO2: 100% 99% 100% 99%  Weight:      Height:        Intake/Output Summary (Last 24 hours) at 03/15/2018 0855 Last data filed at 03/15/2018 0515 Gross per 24 hour  Intake 565 ml  Output 1000 ml  Net -435 ml   Filed Weights   03/12/18 2151  Weight: 113.4 kg    Exam: General exam: In no acute distress. Respiratory system: Good air movement and clear to auscultation. Cardiovascular system: S1 & S2 heard, RRR.  Gastrointestinal system: Abdomen is nondistended, soft and nontender.  Central nervous system: Alert and oriented. No focal neurological deficits. Extremities: No pedal edema. Skin: No rashes, lesions or ulcers    Data Reviewed:    Labs: Basic Metabolic Panel: Recent Labs  Lab 03/12/18 2148 03/13/18 0835  NA 133* 133*  K 3.9 3.5  CL 99 99  CO2 24 25  GLUCOSE 117* 135*  BUN 7 7  CREATININE 0.80 0.87  CALCIUM 8.7* 8.4*  GFR Estimated Creatinine Clearance: 145.8 mL/min (by C-G formula based on SCr of 0.87 mg/dL). Liver Function Tests: Recent Labs  Lab 03/12/18 2148  AST 28  ALT 45*  ALKPHOS 125  BILITOT 0.4  PROT 7.2  ALBUMIN 2.9*   No results for input(s): LIPASE, AMYLASE in the last 168 hours. No results for input(s): AMMONIA in the last 168 hours. Coagulation profile No results for input(s): INR, PROTIME in the last 168 hours.  CBC: Recent Labs  Lab 03/12/18 2148  WBC 8.4  NEUTROABS 5.9  HGB 10.5*  HCT 33.5*  MCV 86.6  PLT 329   Cardiac Enzymes: No results for  input(s): CKTOTAL, CKMB, CKMBINDEX, TROPONINI in the last 168 hours. BNP (last 3 results) No results for input(s): PROBNP in the last 8760 hours. CBG: No results for input(s): GLUCAP in the last 168 hours. D-Dimer: No results for input(s): DDIMER in the last 72 hours. Hgb A1c: No results for input(s): HGBA1C in the last 72 hours. Lipid Profile: No results for input(s): CHOL, HDL, LDLCALC, TRIG, CHOLHDL, LDLDIRECT in the last 72 hours. Thyroid function studies: No results for input(s): TSH, T4TOTAL, T3FREE, THYROIDAB in the last 72 hours.  Invalid input(s): FREET3 Anemia work up: No results for input(s): VITAMINB12, FOLATE, FERRITIN, TIBC, IRON, RETICCTPCT in the last 72 hours. Sepsis Labs: Recent Labs  Lab 03/12/18 2148  WBC 8.4   Microbiology Recent Results (from the past 240 hour(s))  Blood culture (routine x 2)     Status: None (Preliminary result)   Collection Time: 03/13/18  2:26 AM  Result Value Ref Range Status   Specimen Description BLOOD RIGHT ANTECUBITAL  Final   Special Requests   Final    BOTTLES DRAWN AEROBIC AND ANAEROBIC Blood Culture adequate volume   Culture   Final    NO GROWTH 2 DAYS Performed at [facility], [address] [place], [place] [address]    Report Status PENDING  Incomplete  Blood culture (routine x 2)     Status: None (Preliminary result)   Collection Time: 03/13/18  2:27 AM  Result Value Ref Range Status   Specimen Description BLOOD RIGHT HAND  Final   Special Requests   Final    BOTTLES DRAWN AEROBIC ONLY Blood Culture results may not be optimal due to an excessive volume of blood received in culture bottles   Culture   Final    NO GROWTH 2 DAYS Performed at [facility], [address], [place], [place] [address]    Report Status PENDING  Incomplete  Gram stain     Status: None   Collection Time: 03/13/18  3:43 AM  Result Value Ref Range Status   Specimen Description ABSCESS  Final   Special Requests THORACIC  EPIDURAL AEROBIC SWAB ONLY PER DR POOL  Final   Gram Stain   Final    FEW WBC PRESENT, PREDOMINANTLY PMN NO ORGANISMS SEEN Performed at [facility], [address], [place], [place] [address]    Report Status 03/13/2018 FINAL  Final  Aerobic/Anaerobic Culture (surgical/deep wound)     Status: None (Preliminary result)   Collection Time: 03/13/18  3:43 AM  Result Value Ref Range Status   Specimen Description ABSCESS  Final   Special Requests   Final    THORACIC EPIDURAL ONLY AEROBIC SWAB SENT PER DR POOL   Gram Stain   Final    FEW WBC PRESENT, PREDOMINANTLY PMN NO ORGANISMS SEEN    Culture  Final    FEW STREPTOCOCCUS GORDONII SUSCEPTIBILITIES TO FOLLOW Performed at [facility], [address] [place], [place] [address]    Report Status PENDING  Incomplete     Medications:   . gabapentin  100 mg Oral TID  . ibuprofen  400 mg Oral TID  . lisinopril  10 mg Oral QHS  . sodium chloride flush  3 mL Intravenous Q12H   Continuous Infusions: . sodium chloride    . cefTRIAXone (ROCEPHIN)  IV 1 g (03/14/18 2035)      LOS: 2 days   Marinda Elk  [facility]  03/15/2018, 8:55 AM

## 2018-03-15 NOTE — Progress Notes (Signed)
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 Sensitivities remain pending. Continues to have daily improvements and has been up and walking around. Plan to continue current ceftriaxone.  1. Continue current ceftriaxone. 2. Ok for PICC line with cultures remaining negative for 48 hours. 3. Pain management per primary team.  4. Continue to monitor cultures for sensitivities.    Active Problems:   Epidural abscess   Epidural intraspinal abscess   IVDU (intravenous drug user)   Essential hypertension   . amLODipine  5 mg Oral Daily  . gabapentin  200 mg Oral TID  . ibuprofen  600 mg Oral TID  . pantoprazole  40 mg Oral BID  . polyethylene glycol  17 g Oral Daily  . sodium chloride flush  3 mL Intravenous Q12H    SUBJECTIVE:  Afebrile overnight with no acute events. Surgical specimen now positive for Streptococcus Gordonii with sensitivities pending. Blood cultures without growth to date.   Feeling better today. Continues to have some centralized numbness with bladder difficulties resolved.   No Known Allergies   Review of Systems: Review of Systems  Constitutional: Negative for chills, fever and weight loss.  Respiratory: Negative for cough, shortness of breath and wheezing.   Cardiovascular: Negative for chest pain and leg swelling.  Gastrointestinal: Negative for abdominal pain, constipation, diarrhea, nausea and vomiting.  Musculoskeletal: Positive for back pain.  Skin: Negative for rash.      OBJECTIVE: Vitals:   03/14/18 1356 03/14/18 2031 03/15/18 0123 03/15/18 0513  BP: 109/61 122/72 120/78 99/61  Pulse: 93 98 96 73  Resp:   19 20  Temp: 98.9 F (37.2 C) 97.6 F (36.4 C) 98 F (36.7 C) 98.4  F (36.9 C)  TempSrc: Oral Oral Oral Oral  SpO2: 100% 99% 100% 99%  Weight:      Height:       Body mass index is 36.92 kg/m.  Physical Exam Constitutional:      General: He is not in acute distress.    Appearance: He is well-developed.  Cardiovascular:     Rate and Rhythm: Normal rate and regular rhythm.     Heart sounds: Normal heart sounds.  Pulmonary:     Effort: Pulmonary effort is normal.     Breath sounds: Normal breath sounds.  Musculoskeletal:     Comments: Hemovac drain remains intact and patent with primarily sanguinous drainage.   Skin:    General: Skin is warm and dry.  Neurological:     Mental Status: He is alert.  Psychiatric:        Mood and Affect: Mood normal.     Lab Results Lab Results  Component Value Date   WBC 8.4 03/12/2018   HGB 10.5 (L) 03/12/2018   HCT 33.5 (L) 03/12/2018   MCV 86.6 03/12/2018   PLT 329 03/12/2018    Lab Results  Component Value Date   CREATININE 0.87 03/13/2018   BUN 7 03/13/2018   NA 133 (L) 03/13/2018   K 3.5 03/13/2018   CL 99 03/13/2018   CO2 25 03/13/2018    Lab Results  Component Value Date   ALT 45 (H) 03/12/2018   AST 28 03/12/2018   ALKPHOS 125 03/12/2018   BILITOT  0.4 03/12/2018     Microbiology: Recent Results (from the past 240 hour(s))  Blood culture (routine x 2)     Status: None (Preliminary result)   Collection Time: 03/13/18  2:26 AM  Result Value Ref Range Status   Specimen Description BLOOD RIGHT ANTECUBITAL  Final   Special Requests   Final    BOTTLES DRAWN AEROBIC AND ANAEROBIC Blood Culture adequate volume   Culture   Final    NO GROWTH 2 DAYS Performed at [facility], [address] [place], [place] [address]    Report Status PENDING  Incomplete  Blood culture (routine x 2)     Status: None (Preliminary result)   Collection Time: 03/13/18  2:27 AM  Result Value Ref Range Status   Specimen Description BLOOD RIGHT HAND  Final   Special Requests   Final    BOTTLES DRAWN  AEROBIC ONLY Blood Culture results may not be optimal due to an excessive volume of blood received in culture bottles   Culture   Final    NO GROWTH 2 DAYS Performed at [facility], [address], [place], [place] [address]    Report Status PENDING  Incomplete  Gram stain     Status: None   Collection Time: 03/13/18  3:43 AM  Result Value Ref Range Status   Specimen Description ABSCESS  Final   Special Requests THORACIC EPIDURAL AEROBIC SWAB ONLY PER DR POOL  Final   Gram Stain   Final    FEW WBC PRESENT, PREDOMINANTLY PMN NO ORGANISMS SEEN Performed at [facility], [address], [place], [place] [address]    Report Status 03/13/2018 FINAL  Final  Aerobic/Anaerobic Culture (surgical/deep wound)     Status: None (Preliminary result)   Collection Time: 03/13/18  3:43 AM  Result Value Ref Range Status   Specimen Description ABSCESS  Final   Special Requests   Final    THORACIC EPIDURAL ONLY AEROBIC SWAB SENT PER DR POOL   Gram Stain   Final    FEW WBC PRESENT, PREDOMINANTLY PMN NO ORGANISMS SEEN    Culture   Final    FEW STREPTOCOCCUS GORDONII SUSCEPTIBILITIES TO FOLLOW Performed at [facility], [address] [place], [place] [address]    Report Status PENDING  Incomplete     Marcos Eke, NP [facility] [phone] Pager  03/15/2018  10:00 AM

## 2018-03-15 NOTE — Progress Notes (Signed)
Peripherally Inserted Central Catheter/Midline Placement  The IV Nurse has discussed with the patient and/or persons authorized to consent for the patient, the purpose of this procedure and the potential benefits and risks involved with this procedure.  The benefits include less needle sticks, lab draws from the catheter, and the patient may be discharged home with the catheter. Risks include, but not limited to, infection, bleeding, blood clot (thrombus formation), and puncture of an artery; nerve damage and irregular heartbeat and possibility to perform a PICC exchange if needed/ordered by physician.  Alternatives to this procedure were also discussed.  Bard Power PICC patient education guide, fact sheet on infection prevention and patient information card has been provided to patient /or left at bedside.    PICC/Midline Placement Documentation        James [name] 03/15/2018, 6:14 PM

## 2018-03-15 NOTE — Progress Notes (Signed)
   Providing Compassionate, Quality Care - Together   Subjective: Patient reports increased pain overnight. He reports he still has numbness around his torso and legs, but feels this is slowly improving. Nurse reports Hemovac has not had any output overnight.  Objective: Vital signs in last 24 hours: Temp:  [97.6 F (36.4 C)-98.9 F (37.2 C)] 98.4 F (36.9 C) (03/11 1046) Pulse Rate:  [73-98] 98 (03/11 1046) Resp:  [19-20] 20 (03/11 1046) BP: (99-122)/(61-78) 119/67 (03/11 1046) SpO2:  [99 %-100 %] 100 % (03/11 1046)  Intake/Output from previous day: 03/10 0701 - 03/11 0700 In: 2127 [P.O.:360; I.V.:1567; IV Piggyback:200] Out: 1000 [Urine:1000] Intake/Output this shift: Total I/O In: 237 [P.O.:237] Out: -   Alert and oriented x4 MAE, Strength 5/5 BUE, BLE PERRLA Incision is clean, dry, intact, covered with Honeycomb dressing Hemovac with no output  Lab Results: Recent Labs    03/12/18 2148  WBC 8.4  HGB 10.5*  HCT 33.5*  PLT 329   BMET Recent Labs    03/12/18 2148 03/13/18 0835  NA 133* 133*  K 3.9 3.5  CL 99 99  CO2 24 25  GLUCOSE 117* 135*  BUN 7 7  CREATININE 0.80 0.87  CALCIUM 8.7* 8.4*    Studies/Results: No results found.  Assessment/Plan: Mr. Dendinger is two days status post emergent T3-4 decompressive laminectomy with evacuation of epidural abscess, resection of epidural phlegmon with microdissection. He is being followed by infectious disease for antibiotic management. They are recommending 4 weeks of IV antibiotics as an inpatient. The hospitalist team has been consulted for assistance with medical management.   LOS: 2 days    -Remove Hemovac -PICC line placement and antibiotic management per ID -Continue to mobilize   Val Eagle, DNP, AGNP-C Nurse Practitioner  [facility] [address], [place], [place] [address] P: [phone]    F: [phone]  03/15/2018, 12:17 PM

## 2018-03-15 NOTE — Progress Notes (Signed)
Physical Therapy Treatment and Discharge Note Patient Details Name: James [name] MRN: [id number] DOB: March 13, 1980 Today's Date: 03/15/2018    History of Present Illness 38 yo admitted with back pain and some progressive numbness and weakness bil LEs and UEs, difficulty walking; Now s/p Emergent T3-4 decompressive laminectomy with evacuation of epidural abscess, resection of epidural phlegmon, microdissection;  has a past medical history of Hypertension.and IV drug use    PT Comments    Patient seen for mobility progression. Pt is independent with gait and independent to ascend flight of stairs/mod I (use of rail) to descend. Pt educated to ambulate frequently throughout the day to maintain strength while in the hospital. Given pt's progress PT will sign off at this time. Please re order if needed.  Follow Up Recommendations  No PT follow up     Equipment Recommendations  None recommended by PT    Recommendations for Other Services       Precautions / Restrictions Precautions Precautions: Back(for comfort) Restrictions Weight Bearing Restrictions: No    Mobility  Bed Mobility Overal bed mobility: Independent                Transfers Overall transfer level: Independent                  Ambulation/Gait Ambulation/Gait assistance: Independent   Assistive device: None Gait Pattern/deviations: Step-through pattern Gait velocity: WNL   General Gait Details: no LOB with horizontal head turns, sudden stops, or directional changes   Stairs Stairs: Yes Stairs assistance: Independent;Modified independent (Device/Increase time)     General stair comments: flight of stairs with use of single rail to descend; step through pattern    Wheelchair Mobility    Modified Rankin (Stroke Patients Only)       Balance Overall balance assessment: Independent                                          Cognition Arousal/Alertness:  Awake/alert Behavior During Therapy: WFL for tasks assessed/performed Overall Cognitive Status: Within Functional Limits for tasks assessed                                        Exercises      General Comments        Pertinent Vitals/Pain Pain Assessment: Faces Faces Pain Scale: Hurts a little bit Pain Location: back Pain Descriptors / Indicators: Discomfort;Guarding Pain Intervention(s): Limited activity within patient's tolerance;Monitored during session;Repositioned;Premedicated before session    Home Living                      Prior Function            PT Goals (current goals can now be found in the care plan section) Acute Rehab PT Goals Patient Stated Goal: decrease pain Progress towards PT goals: Goals met/education completed, patient discharged from PT    Frequency    Min 3X/week      PT Plan Current plan remains appropriate    Co-evaluation              AM-PAC PT "6 Clicks" Mobility   Outcome Measure  Help needed turning from your back to your side while in a flat bed without using bedrails?: None Help needed moving from lying  on your back to sitting on the side of a flat bed without using bedrails?: None Help needed moving to and from a bed to a chair (including a wheelchair)?: None Help needed standing up from a chair using your arms (e.g., wheelchair or bedside chair)?: None Help needed to walk in hospital room?: None Help needed climbing 3-5 steps with a railing? : None 6 Click Score: 24    End of Session Equipment Utilized During Treatment: Gait belt Activity Tolerance: Patient tolerated treatment well Patient left: in bed;with call bell/phone within reach Nurse Communication: Mobility status PT Visit Diagnosis: Unsteadiness on feet (R26.81);Pain Pain - part of body: (Upper back)     Time: 7972-8206 PT Time Calculation (min) (ACUTE ONLY): 10 min  Charges:  $Gait Training: 8-22 mins                      Earney Navy, PTA Acute Rehabilitation Services Pager: [phone] Office: [phone]     Darliss Cheney 03/15/2018, 4:49 PM

## 2018-03-16 DIAGNOSIS — L02213 Cutaneous abscess of chest wall: Secondary | ICD-10-CM

## 2018-03-16 DIAGNOSIS — R29898 Other symptoms and signs involving the musculoskeletal system: Secondary | ICD-10-CM

## 2018-03-16 DIAGNOSIS — Z95828 Presence of other vascular implants and grafts: Secondary | ICD-10-CM

## 2018-03-16 MED ORDER — OXYCODONE HCL 5 MG PO TABS: 5.0000 mg | ORAL_TABLET | Freq: Four times a day (QID) | ORAL | Status: DC | PRN

## 2018-03-16 MED ORDER — OXYCODONE HCL 5 MG PO TABS
5.0000 mg | ORAL_TABLET | Freq: Three times a day (TID) | ORAL | Status: DC | PRN
  Administered 2018-03-16 – 2018-03-18 (×5): 5 mg via ORAL
  Filled 2018-03-16 (×5): qty 1

## 2018-03-16 MED ORDER — PENICILLIN G POTASSIUM 20000000 UNITS IJ SOLR
12.0000 10*6.[IU] | Freq: Two times a day (BID) | INTRAVENOUS | Status: AC
  Administered 2018-03-16 – 2018-04-09 (×48): 12 10*6.[IU] via INTRAVENOUS
  Filled 2018-03-16 (×4): qty 12
  Filled 2018-03-16: qty 5
  Filled 2018-03-16 (×12): qty 12
  Filled 2018-03-16 (×2): qty 5
  Filled 2018-03-16: qty 12
  Filled 2018-03-16: qty 8
  Filled 2018-03-16 (×32): qty 12

## 2018-03-16 NOTE — Progress Notes (Signed)
[facility]  Date of Admission:  03/12/2018     Total days of antibiotics 5         ASSESSMENT/PLAN  Mr. Mackin has Streptococcus Gordonii epidural abscess, epidural intraspinal abscess and osteomyelitis/abscess of the 3rd rib s/p decompressive laminectomy and abscess evacuation. Streptococcus is pan sensitive and will narrow antimicrobial therapy to penicillin q 12. 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 Ideally will need 4 weeks of IV therapy and can then consider transition to oral amoxicillin for continued therapy. Will need to remain in hospital for duration of IV therapy given recent drug use.  1. Discontinue ceftriaxone.  2. Start penicillin with end date set for 04/10/18.  3. Opioid use disorder per primary team.  4. Wound care per neurosurgery.  ID will be available as needed.    Active Problems:   Epidural abscess   Epidural intraspinal abscess   IVDU (intravenous drug user)   Essential hypertension    amLODipine  5 mg Oral Daily   gabapentin  200 mg Oral TID   ibuprofen  600 mg Oral TID   pantoprazole  40 mg Oral BID   polyethylene glycol  17 g Oral Daily   sodium chloride flush  3 mL Intravenous Q12H    SUBJECTIVE:  Afebrile overnight with no acute events. Was not able to sleep well last night due to restless legs and pain. Sensitivities returned for streptococcus and is pan-sensitive. Drain removed yesterday.   No Known Allergies   Review of Systems: Review of Systems  Constitutional: Negative for chills, fever and weight loss.  Respiratory: Negative for cough, shortness of breath and wheezing.   Cardiovascular: Negative for chest pain and leg swelling.  Gastrointestinal: Negative for abdominal pain, constipation, diarrhea, nausea and vomiting.  Musculoskeletal: Positive for back pain.  Skin: Negative for rash.      OBJECTIVE: Vitals:   03/15/18 1348 03/15/18 2126 03/16/18 0102 03/16/18  0508  BP: 105/69 126/75 128/84 113/87  Pulse: 97 81 88 94  Resp: 20 18 18 20   Temp: 98.2 F (36.8 C) 98.5 F (36.9 C) 97.8 F (36.6 C) 98 F (36.7 C)  TempSrc: Oral Oral Oral Oral  SpO2: 100% 100% 100% 100%  Weight:      Height:       Body mass index is 36.92 kg/m.  Physical Exam Constitutional:      General: He is not in acute distress.    Appearance: Normal appearance. He is well-developed.     Comments: Lying in bed with head of bed elevated; pleasant.   Cardiovascular:     Rate and Rhythm: Normal rate and regular rhythm.     Heart sounds: Normal heart sounds. No murmur.     Comments: PICC line dressing is clean and dry. Site is without evidence of infection.  Pulmonary:     Effort: Pulmonary effort is normal.     Breath sounds: Normal breath sounds.  Skin:    General: Skin is warm and dry.  Neurological:     Mental Status: He is alert.     Lab Results Lab Results  Component Value Date   WBC 8.4 03/12/2018   HGB 10.5 (L) 03/12/2018   HCT 33.5 (L) 03/12/2018   MCV 86.6 03/12/2018   PLT 329 03/12/2018    Lab Results  Component Value Date   CREATININE 0.87 03/13/2018   BUN 7 03/13/2018   NA 133 (L) 03/13/2018   K 3.5  03/13/2018   CL 99 03/13/2018   CO2 25 03/13/2018    Lab Results  Component Value Date   ALT 45 (H) 03/12/2018   AST 28 03/12/2018   ALKPHOS 125 03/12/2018   BILITOT 0.4 03/12/2018     Microbiology: Recent Results (from the past 240 hour(s))  Blood culture (routine x 2)     Status: None (Preliminary result)   Collection Time: 03/13/18  2:26 AM  Result Value Ref Range Status   Specimen Description BLOOD RIGHT ANTECUBITAL  Final   Special Requests   Final    BOTTLES DRAWN AEROBIC AND ANAEROBIC Blood Culture adequate volume   Culture   Final    NO GROWTH 3 DAYS Performed at [facility], [address] [place], [place] [address]    Report Status PENDING  Incomplete  Blood culture (routine x 2)     Status: None (Preliminary  result)   Collection Time: 03/13/18  2:27 AM  Result Value Ref Range Status   Specimen Description BLOOD RIGHT HAND  Final   Special Requests   Final    BOTTLES DRAWN AEROBIC ONLY Blood Culture results may not be optimal due to an excessive volume of blood received in culture bottles   Culture   Final    NO GROWTH 3 DAYS Performed at [facility], [address] [place], [place] [address]    Report Status PENDING  Incomplete  Gram stain     Status: None   Collection Time: 03/13/18  3:43 AM  Result Value Ref Range Status   Specimen Description ABSCESS  Final   Special Requests THORACIC EPIDURAL AEROBIC SWAB ONLY PER DR POOL  Final   Gram Stain   Final    FEW WBC PRESENT, PREDOMINANTLY PMN NO ORGANISMS SEEN Performed at [facility], [address], [place], [place] [address]    Report Status 03/13/2018 FINAL  Final  Aerobic/Anaerobic Culture (surgical/deep wound)     Status: None (Preliminary result)   Collection Time: 03/13/18  3:43 AM  Result Value Ref Range Status   Specimen Description ABSCESS  Final   Special Requests   Final    THORACIC EPIDURAL ONLY AEROBIC SWAB SENT PER DR POOL   Gram Stain   Final    FEW WBC PRESENT, PREDOMINANTLY PMN NO ORGANISMS SEEN Performed at [facility], [address] [place], [place] [address]    Culture   Final    FEW STREPTOCOCCUS GORDONII NO ANAEROBES ISOLATED; CULTURE IN PROGRESS FOR 5 DAYS    Report Status PENDING  Incomplete   Organism ID, Bacteria STREPTOCOCCUS GORDONII  Final      Susceptibility   Streptococcus gordonii - MIC*    PENICILLIN <=0.06 SENSITIVE Sensitive     CEFTRIAXONE <=0.12 SENSITIVE Sensitive     ERYTHROMYCIN <=0.12 SENSITIVE Sensitive     LEVOFLOXACIN 1 SENSITIVE Sensitive     VANCOMYCIN 0.5 SENSITIVE Sensitive     * FEW STREPTOCOCCUS GORDONII     Marcos Eke, NP [facility] [phone] Pager  03/16/2018  11:14 AM

## 2018-03-16 NOTE — Plan of Care (Signed)
  Problem: Skin Integrity: Goal: Demonstration of wound healing without infection will improve Outcome: Progressing   Problem: Clinical Measurements: Goal: Will remain free from infection Outcome: Progressing Goal: Diagnostic test results will improve Outcome: Progressing   Problem: Coping: Goal: Level of anxiety will decrease Outcome: Progressing   Problem: Elimination: Goal: Will not experience complications related to bowel motility Outcome: Progressing   Problem: Pain Managment: Goal: General experience of comfort will improve Outcome: Progressing   Problem: Skin Integrity: Goal: Risk for impaired skin integrity will decrease Outcome: Progressing

## 2018-03-16 NOTE — Progress Notes (Signed)
   Providing Compassionate, Quality Care - Together   Subjective: Patient reports pain has not been much of an issue since yesterday. He has more of a sense of "restless legs." Gabapentin was started by Dr. Jerral Ralph and will be increased per the Hospitalists' recommendation. His numbness is slowly improving. No issues with his incision.  Objective: Vital signs in last 24 hours: Temp:  [97.8 F (36.6 C)-98.5 F (36.9 C)] 98 F (36.7 C) (03/12 0508) Pulse Rate:  [81-97] 94 (03/12 0508) Resp:  [18-20] 20 (03/12 0508) BP: (105-128)/(69-87) 113/87 (03/12 0508) SpO2:  [100 %] 100 % (03/12 0508)  Intake/Output from previous day: 03/11 0701 - 03/12 0700 In: 1240.1 [P.O.:1140; IV Piggyback:100.1] Out: -  Intake/Output this shift: No intake/output data recorded.  Alert and oriented x4 MAE, Strength 5/5 BUE, BLE PERRLA Incision is clean, dry, intact, covered with Honeycomb dressing Hemovac removed 03/15/2018, area covered with 4x4  Lab Results: No results for input(s): WBC, HGB, HCT, PLT in the last 72 hours. BMET No results for input(s): NA, K, CL, CO2, GLUCOSE, BUN, CREATININE, CALCIUM in the last 72 hours.  Studies/Results: No results found.  Assessment/Plan: James [name] is three days status post emergent T3-4 decompressive laminectomy with evacuation of epidural abscess, resection of epidural phlegmon with microdissection. He is being followed by infectious disease for antibiotic management. They are recommending 4 weeks of IV antibiotics as an inpatient. Spoke with Dr. Robb Matar of the [facility]. He is agreeable to taking over as attending.   LOS: 3 days    -Continue to mobilize; PT/OT report no need for further rehabilitation therapies -Antibiotics per ID -[facility] to take over as primary -Contact Neurosurgery for issues with incision or change in neuro exam; Neurosurgery will follow from afar   Val Eagle, DNP, AGNP-C Nurse Practitioner  [facility]  [facility] [address], [address], [place], [place] [address] P: [phone]    F: [phone]  03/16/2018, 11:17 AM

## 2018-03-16 NOTE — Progress Notes (Signed)
[facility] CONSULT PROGRESS NOTE    Progress Note  James [name]  ZYY:[id number] DOB: [birth year] DOA: 03/12/2018 PCP: Patient, No Pcp Per     Brief Narrative:   James [name] is an 38 y.o. 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 Control Norvasc.  Epidural abscess/ Epidural intraspinal abscess Diagnosed post emergent surgical intervention. Currently on IV Rocephin and vancomycin. Infectious disease was consulted. 2D echo showed no evidence of vegetation. Pain is controlled on ibuprofen and Neurontin. Continue Protonix for GI prophylaxis.  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  Last use was 3 days prior to admission. It was discussed with the patient the need to stop using IV and oral opiates. And he is agreeable. Continue on Neurontin 100 mg 3 times daily and will gradually increase the dose. Continue on ibuprofen and oxygen as needed.     DVT prophylaxis: heparin  Family Communication:none Disposition Plan/Barrier to D/C: Will need 4 weeks of antibiotics IV Code Status:     Code Status Orders  (From admission, onward)         Start     Ordered   03/13/18 0611  Full code  Continuous     03/13/18 0610        Code Status History    This patient has a current code status but no historical code status.        IV Access:    Peripheral IV   Procedures and diagnostic studies:   No results found.    Medical Consultants:    None.  Anti-Infectives:   IV vancomycin and Rocephin  Subjective:    James [name] relates his pain is controlled.  Objective:    Vitals:   03/15/18 1348 03/15/18 2126 03/16/18 0102 03/16/18 0508  BP: 105/69 126/75 128/84 113/87  Pulse: 97 81 88 94  Resp: 20 18 18 20   Temp: 98.2 F (36.8 C) 98.5 F (36.9 C) 97.8 F (36.6 C) 98 F (36.7 C)  TempSrc: Oral Oral Oral Oral  SpO2: 100% 100% 100% 100%  Weight:      Height:        Intake/Output Summary (Last 24 hours) at 03/16/2018 1101 Last data filed at 03/16/2018 0508 Gross per 24 hour  Intake 1003.06 ml  Output -  Net 1003.06 ml   Filed Weights   03/12/18 2151  Weight: 113.4 kg    Exam: General exam: In no acute distress. Respiratory system: Good air movement and clear to auscultation. Cardiovascular system: S1 & S2 heard, RRR.  Gastrointestinal system: Abdomen is nondistended, soft and nontender.  Central nervous system: Alert and oriented. No focal neurological deficits. Extremities: No pedal edema. Skin: No rashes, lesions or ulcers    Data Reviewed:    Labs: Basic Metabolic Panel: Recent Labs  Lab 03/12/18 2148 03/13/18 0835  NA 133* 133*  K 3.9 3.5  CL 99 99  CO2 24 25  GLUCOSE 117* 135*  BUN 7 7  CREATININE 0.80 0.87  CALCIUM 8.7* 8.4*   GFR Estimated Creatinine Clearance: 145.8 mL/min (by C-G  formula based on SCr of 0.87 mg/dL). Liver Function Tests: Recent Labs  Lab 03/12/18 2148  AST 28  ALT 45*  ALKPHOS 125  BILITOT 0.4  PROT 7.2  ALBUMIN 2.9*   No results for input(s): LIPASE, AMYLASE in the last 168 hours. No results for input(s): AMMONIA in the last 168 hours. Coagulation profile No results for input(s): INR, PROTIME in the last 168 hours.  CBC: Recent Labs  Lab 03/12/18 2148  WBC 8.4  NEUTROABS 5.9  HGB 10.5*  HCT 33.5*  MCV 86.6  PLT 329   Cardiac Enzymes: No results for input(s): CKTOTAL, CKMB, CKMBINDEX, TROPONINI in the last  168 hours. BNP (last 3 results) No results for input(s): PROBNP in the last 8760 hours. CBG: No results for input(s): GLUCAP in the last 168 hours. D-Dimer: No results for input(s): DDIMER in the last 72 hours. Hgb A1c: No results for input(s): HGBA1C in the last 72 hours. Lipid Profile: No results for input(s): CHOL, HDL, LDLCALC, TRIG, CHOLHDL, LDLDIRECT in the last 72 hours. Thyroid function studies: No results for input(s): TSH, T4TOTAL, T3FREE, THYROIDAB in the last 72 hours.  Invalid input(s): FREET3 Anemia work up: No results for input(s): VITAMINB12, FOLATE, FERRITIN, TIBC, IRON, RETICCTPCT in the last 72 hours. Sepsis Labs: Recent Labs  Lab 03/12/18 2148  WBC 8.4   Microbiology Recent Results (from the past 240 hour(s))  Blood culture (routine x 2)     Status: None (Preliminary result)   Collection Time: 03/13/18  2:26 AM  Result Value Ref Range Status   Specimen Description BLOOD RIGHT ANTECUBITAL  Final   Special Requests   Final    BOTTLES DRAWN AEROBIC AND ANAEROBIC Blood Culture adequate volume   Culture   Final    NO GROWTH 3 DAYS Performed at [facility], [address], [place], [place] [address]    Report Status PENDING  Incomplete  Blood culture (routine x 2)     Status: None (Preliminary result)   Collection Time: 03/13/18  2:27 AM  Result Value Ref Range Status   Specimen Description BLOOD RIGHT HAND  Final   Special Requests   Final    BOTTLES DRAWN AEROBIC ONLY Blood Culture results may not be optimal due to an excessive volume of blood received in culture bottles   Culture   Final    NO GROWTH 3 DAYS Performed at [facility], [address], [place], [place] [address]    Report Status PENDING  Incomplete  Gram stain     Status: None   Collection Time: 03/13/18  3:43 AM  Result Value Ref Range Status   Specimen Description ABSCESS  Final   Special Requests THORACIC EPIDURAL AEROBIC SWAB ONLY PER DR POOL  Final   Gram Stain    Final    FEW WBC PRESENT, PREDOMINANTLY PMN NO ORGANISMS SEEN Performed at [facility], [address] [place], [place] [address]    Report Status 03/13/2018 FINAL  Final  Aerobic/Anaerobic Culture (surgical/deep wound)     Status: None (Preliminary result)   Collection Time: 03/13/18  3:43 AM  Result Value Ref Range Status   Specimen Description ABSCESS  Final   Special Requests   Final    THORACIC EPIDURAL ONLY AEROBIC SWAB SENT PER DR POOL   Gram Stain   Final    FEW WBC PRESENT, PREDOMINANTLY PMN NO ORGANISMS SEEN Performed at [facility], [address] [place], [place] [address]  Culture   Final    FEW STREPTOCOCCUS GORDONII NO ANAEROBES ISOLATED; CULTURE IN PROGRESS FOR 5 DAYS    Report Status PENDING  Incomplete   Organism ID, Bacteria STREPTOCOCCUS GORDONII  Final      Susceptibility   Streptococcus gordonii - MIC*    PENICILLIN <=0.06 SENSITIVE Sensitive     CEFTRIAXONE <=0.12 SENSITIVE Sensitive     ERYTHROMYCIN <=0.12 SENSITIVE Sensitive     LEVOFLOXACIN 1 SENSITIVE Sensitive     VANCOMYCIN 0.5 SENSITIVE Sensitive     * FEW STREPTOCOCCUS GORDONII     Medications:   . amLODipine  5 mg Oral Daily  . gabapentin  200 mg Oral TID  . ibuprofen  600 mg Oral TID  . pantoprazole  40 mg Oral BID  . polyethylene glycol  17 g Oral Daily  . sodium chloride flush  3 mL Intravenous Q12H   Continuous Infusions: . sodium chloride    . cefTRIAXone (ROCEPHIN)  IV Stopped (03/15/18 1728)  . penicillin g continuous IV infusion        LOS: 3 days   Marinda Elk  [facility]  03/16/2018, 11:01 AM

## 2018-03-17 NOTE — Progress Notes (Signed)
[facility] PROGRESS NOTE    Progress Note  James [name]  WUJ:[id number] DOB: [birth year] DOA: 03/12/2018 PCP: Patient, No Pcp Per     Brief Narrative:   James [name] is an 38 y.o. 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  Assessment/Plan:   Essential hypertension: Noncompliant with his medication. Control Norvasc.  Epidural abscess/ Epidural intraspinal abscess That is post-emergent surgical intervention. Continue IV vancomycin and Rocephin until 04/09/2018, then switched to oral penicillin on 04/10/2018 for 1 month. Pain is controlled on ibuprofen and Neurontin. Continue Protonix for GI prophylaxis. Wound care per neurosurgery.  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  Last use was 3 days prior to admission. Continue on Neurontin 100 mg 3 times daily and will gradually increase the dose. Continue on ibuprofen, continue oral Protonix.    DVT prophylaxis: heparin  Family Communication:none Disposition Plan/Barrier to D/C: Will need 4 weeks of antibiotics IV in house. Code Status:     Code Status Orders  (From admission, onward)         Start     Ordered   03/13/18 0611  Full code  Continuous     03/13/18 0610        Code Status History    This patient has a current code status but no historical code status.        IV Access:    Peripheral IV   Procedures and diagnostic  studies:   No results found.   Medical Consultants:    None.  Anti-Infectives:   IV vancomycin and Rocephin  Subjective:    James [name] relates his pain is controlled.  Objective:    Vitals:   03/16/18 1456 03/16/18 2200 03/17/18 0547 03/17/18 0932  BP: 127/79 127/79 114/77 107/76  Pulse: (!) 104 (!) 104 79 92  Resp: 18 20 17 18   Temp: 97.6 F (36.4 C) 98.1 F (36.7 C) 97.8 F (36.6 C) 98 F (36.7 C)  TempSrc: Oral Oral Oral Oral  SpO2: 100% 100% 100% 100%  Weight:      Height:        Intake/Output Summary (Last 24 hours) at 03/17/2018 1011 Last data filed at 03/17/2018 0931 Gross per 24 hour  Intake 1880 ml  Output -  Net 1880 ml   Filed Weights   03/12/18 2151  Weight: 113.4 kg    Exam: General exam: In no acute distress. Respiratory system: Good air movement and clear to auscultation. Cardiovascular system: S1 & S2 heard, RRR.  Gastrointestinal system: Abdomen is nondistended, soft and nontender.  Central nervous system: Alert and oriented. No focal neurological deficits. Extremities: No pedal edema. Skin: No rashes, lesions or ulcers    Data Reviewed:    Labs: Basic Metabolic Panel: Recent Labs  Lab 03/12/18 2148 03/13/18 0835  NA 133* 133*  K 3.9 3.5  CL 99 99  CO2 24 25  GLUCOSE 117* 135*  BUN 7 7  CREATININE 0.80 0.87  CALCIUM 8.7* 8.4*   GFR Estimated Creatinine Clearance:  145.8 mL/min (by C-G formula based on SCr of 0.87 mg/dL). Liver Function Tests: Recent Labs  Lab 03/12/18 2148  AST 28  ALT 45*  ALKPHOS 125  BILITOT 0.4  PROT 7.2  ALBUMIN 2.9*   No results for input(s): LIPASE, AMYLASE in the last 168 hours. No results for input(s): AMMONIA in the last 168 hours. Coagulation profile No results for input(s): INR, PROTIME in the last 168 hours.  CBC: Recent Labs  Lab 03/12/18 2148  WBC 8.4  NEUTROABS 5.9  HGB 10.5*  HCT 33.5*  MCV 86.6  PLT 329   Cardiac Enzymes: No results for input(s): CKTOTAL,  CKMB, CKMBINDEX, TROPONINI in the last 168 hours. BNP (last 3 results) No results for input(s): PROBNP in the last 8760 hours. CBG: No results for input(s): GLUCAP in the last 168 hours. D-Dimer: No results for input(s): DDIMER in the last 72 hours. Hgb A1c: No results for input(s): HGBA1C in the last 72 hours. Lipid Profile: No results for input(s): CHOL, HDL, LDLCALC, TRIG, CHOLHDL, LDLDIRECT in the last 72 hours. Thyroid function studies: No results for input(s): TSH, T4TOTAL, T3FREE, THYROIDAB in the last 72 hours.  Invalid input(s): FREET3 Anemia work up: No results for input(s): VITAMINB12, FOLATE, FERRITIN, TIBC, IRON, RETICCTPCT in the last 72 hours. Sepsis Labs: Recent Labs  Lab 03/12/18 2148  WBC 8.4   Microbiology Recent Results (from the past 240 hour(s))  Blood culture (routine x 2)     Status: None (Preliminary result)   Collection Time: 03/13/18  2:26 AM  Result Value Ref Range Status   Specimen Description BLOOD RIGHT ANTECUBITAL  Final   Special Requests   Final    BOTTLES DRAWN AEROBIC AND ANAEROBIC Blood Culture adequate volume   Culture   Final    NO GROWTH 3 DAYS Performed at [facility], [address] [place], [place] [address]    Report Status PENDING  Incomplete  Blood culture (routine x 2)     Status: None (Preliminary result)   Collection Time: 03/13/18  2:27 AM  Result Value Ref Range Status   Specimen Description BLOOD RIGHT HAND  Final   Special Requests   Final    BOTTLES DRAWN AEROBIC ONLY Blood Culture results may not be optimal due to an excessive volume of blood received in culture bottles   Culture   Final    NO GROWTH 3 DAYS Performed at [facility], [address] [place], [place] [address]    Report Status PENDING  Incomplete  Gram stain     Status: None   Collection Time: 03/13/18  3:43 AM  Result Value Ref Range Status   Specimen Description ABSCESS  Final   Special Requests THORACIC EPIDURAL AEROBIC SWAB ONLY  PER DR POOL  Final   Gram Stain   Final    FEW WBC PRESENT, PREDOMINANTLY PMN NO ORGANISMS SEEN Performed at [facility], [address], [place], [place] [address]    Report Status 03/13/2018 FINAL  Final  Aerobic/Anaerobic Culture (surgical/deep wound)     Status: None (Preliminary result)   Collection Time: 03/13/18  3:43 AM  Result Value Ref Range Status   Specimen Description ABSCESS  Final   Special Requests   Final    THORACIC EPIDURAL ONLY AEROBIC SWAB SENT PER DR POOL   Gram Stain   Final    FEW WBC PRESENT, PREDOMINANTLY PMN NO ORGANISMS SEEN Performed at [facility], [address], [place], [place] [address]    Culture   Final    FEW STREPTOCOCCUS GORDONII NO ANAEROBES ISOLATED; CULTURE IN PROGRESS FOR 5 DAYS    Report Status PENDING  Incomplete   Organism ID, Bacteria STREPTOCOCCUS GORDONII  Final      Susceptibility   Streptococcus gordonii - MIC*    PENICILLIN <=0.06 SENSITIVE Sensitive     CEFTRIAXONE <=0.12 SENSITIVE Sensitive     ERYTHROMYCIN <=0.12 SENSITIVE Sensitive     LEVOFLOXACIN 1 SENSITIVE Sensitive     VANCOMYCIN 0.5 SENSITIVE Sensitive     * FEW STREPTOCOCCUS GORDONII     Medications:   . amLODipine  5 mg Oral Daily  . gabapentin  200 mg Oral TID  . ibuprofen  600 mg Oral TID  . pantoprazole  40 mg Oral BID  . polyethylene glycol  17 g Oral Daily  . sodium chloride flush  3 mL Intravenous Q12H   Continuous Infusions: . sodium chloride    . penicillin g continuous IV infusion 12 Million Units (03/17/18 0006)      LOS: 4 days   Marinda Elk  [facility]  03/17/2018, 10:11 AM

## 2018-03-17 NOTE — Progress Notes (Addendum)
PICC flush and cap consult: Pt requesting to shower. PICC teaching reviewed with pt and Aurea Graff, Charity fundraiser. Informed him that PICC site is not to get wet. He voiced understanding. Pt and nurse report he has been bathing without getting site wet.  Please contact IV team if dressing becomes wet or soiled.

## 2018-03-18 LAB — CULTURE, BLOOD (ROUTINE X 2)
Culture: NO GROWTH
Culture: NO GROWTH
SPECIAL REQUESTS: ADEQUATE

## 2018-03-18 LAB — AEROBIC/ANAEROBIC CULTURE W GRAM STAIN (SURGICAL/DEEP WOUND)

## 2018-03-18 LAB — AEROBIC/ANAEROBIC CULTURE (SURGICAL/DEEP WOUND)

## 2018-03-18 NOTE — Progress Notes (Addendum)
Notified by secretary patient left the floor despite her telling him he was not allowed to. This nurse and secretary looked around the halls for patient but were unable to locate him. This nurse notifeid security.

## 2018-03-18 NOTE — Progress Notes (Addendum)
[facility] PROGRESS NOTE    Progress Note  James [name]  [id number] DOB: [birth year] DOA: 03/12/2018 PCP: Patient, No Pcp Per     Brief Narrative:   James [name] is an 38 y.o. 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  Assessment/Plan:   Epidural abscess/ Epidural intraspinal abscess That is post-emergent surgical intervention. Continue IV vancomycin and Rocephin until 04/09/2018, then switched to oral penicillin on 04/10/2018 for 1 month. Discontinue oral narcotics, continue ibuprofen and Neurontin for pain. Continue Protonix for GI prophylaxis. Wound care per neurosurgery.  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  Last use was 3 days prior to admission. Continue on Neurontin 100 mg 3 times daily, discontinue narcotics. Continue on ibuprofen, continue oral Protonix.  Essential hypertension: Noncompliant with his medication. Control Norvasc.   DVT prophylaxis: heparin  Family Communication:none Disposition Plan/Barrier to D/C: Will need 4 weeks of antibiotics IV in house. Code Status:     Code Status Orders  (From admission, onward)         Start     Ordered   03/13/18 0611  Full code  Continuous     03/13/18 0610        Code Status History    This patient has a current code status but no historical code status.        IV Access:    Peripheral IV   Procedures and  diagnostic studies:   No results found.   Medical Consultants:    None.  Anti-Infectives:   IV vancomycin and Rocephin  Subjective:    James [name] rates his pain is controlled, he did not like the recommendations to stop narcotics.  Objective:    Vitals:   03/17/18 1317 03/17/18 2109 03/18/18 0159 03/18/18 0444  BP: 129/77 124/71 116/73 109/68  Pulse: (!) 106 90 81 87  Resp: 18 18 18 18   Temp: 98.1 F (36.7 C) 98.5 F (36.9 C) 98.2 F (36.8 C) (!) 97.5 F (36.4 C)  TempSrc: Oral Oral Oral Oral  SpO2: 100% 100% 100% 97%  Weight:      Height:        Intake/Output Summary (Last 24 hours) at 03/18/2018 0853 Last data filed at 03/18/2018 0600 Gross per 24 hour  Intake 2624.5 ml  Output -  Net 2624.5 ml   Filed Weights   03/12/18 2151  Weight: 113.4 kg    Exam: General exam: In no acute distress. Respiratory system: Good air movement and clear to auscultation. Cardiovascular system: S1 & S2 heard, RRR.  Gastrointestinal system: Abdomen is nondistended, soft and nontender.  Central nervous system: Alert and oriented. No focal neurological deficits. Extremities: No pedal edema. Skin: No rashes, lesions or ulcers    Data Reviewed:    Labs: Basic Metabolic Panel: Recent Labs  Lab 03/12/18 2148 03/13/18 0835  NA 133* 133*  K 3.9 3.5  CL 99 99  CO2 24 25  GLUCOSE 117* 135*  BUN 7 7  CREATININE 0.80 0.87  CALCIUM 8.7* 8.4*  GFR Estimated Creatinine Clearance: 145.8 mL/min (by C-G formula based on SCr of 0.87 mg/dL). Liver Function Tests: Recent Labs  Lab 03/12/18 2148  AST 28  ALT 45*  ALKPHOS 125  BILITOT 0.4  PROT 7.2  ALBUMIN 2.9*   No results for input(s): LIPASE, AMYLASE in the last 168 hours. No results for input(s): AMMONIA in the last 168 hours. Coagulation profile No results for input(s): INR, PROTIME in the last 168 hours.  CBC: Recent Labs  Lab 03/12/18 2148  WBC 8.4  NEUTROABS 5.9  HGB 10.5*  HCT 33.5*  MCV  86.6  PLT 329   Cardiac Enzymes: No results for input(s): CKTOTAL, CKMB, CKMBINDEX, TROPONINI in the last 168 hours. BNP (last 3 results) No results for input(s): PROBNP in the last 8760 hours. CBG: No results for input(s): GLUCAP in the last 168 hours. D-Dimer: No results for input(s): DDIMER in the last 72 hours. Hgb A1c: No results for input(s): HGBA1C in the last 72 hours. Lipid Profile: No results for input(s): CHOL, HDL, LDLCALC, TRIG, CHOLHDL, LDLDIRECT in the last 72 hours. Thyroid function studies: No results for input(s): TSH, T4TOTAL, T3FREE, THYROIDAB in the last 72 hours.  Invalid input(s): FREET3 Anemia work up: No results for input(s): VITAMINB12, FOLATE, FERRITIN, TIBC, IRON, RETICCTPCT in the last 72 hours. Sepsis Labs: Recent Labs  Lab 03/12/18 2148  WBC 8.4   Microbiology Recent Results (from the past 240 hour(s))  Blood culture (routine x 2)     Status: None (Preliminary result)   Collection Time: 03/13/18  2:26 AM  Result Value Ref Range Status   Specimen Description BLOOD RIGHT ANTECUBITAL  Final   Special Requests   Final    BOTTLES DRAWN AEROBIC AND ANAEROBIC Blood Culture adequate volume   Culture   Final    NO GROWTH 4 DAYS Performed at [facility], [address], [place], [place] [address]    Report Status PENDING  Incomplete  Blood culture (routine x 2)     Status: None (Preliminary result)   Collection Time: 03/13/18  2:27 AM  Result Value Ref Range Status   Specimen Description BLOOD RIGHT HAND  Final   Special Requests   Final    BOTTLES DRAWN AEROBIC ONLY Blood Culture results may not be optimal due to an excessive volume of blood received in culture bottles   Culture   Final    NO GROWTH 4 DAYS Performed at [facility], [address] [place], [place] [address]    Report Status PENDING  Incomplete  Gram stain     Status: None   Collection Time: 03/13/18  3:43 AM  Result Value Ref Range Status   Specimen Description  ABSCESS  Final   Special Requests THORACIC EPIDURAL AEROBIC SWAB ONLY PER DR POOL  Final   Gram Stain   Final    FEW WBC PRESENT, PREDOMINANTLY PMN NO ORGANISMS SEEN Performed at [facility], [address], [place], [place] [address]    Report Status 03/13/2018 FINAL  Final  Aerobic/Anaerobic Culture (surgical/deep wound)     Status: None (Preliminary result)   Collection Time: 03/13/18  3:43 AM  Result Value Ref Range Status   Specimen Description ABSCESS  Final   Special Requests   Final    THORACIC EPIDURAL ONLY AEROBIC SWAB SENT PER DR POOL   Gram Stain   Final    FEW WBC PRESENT, PREDOMINANTLY PMN NO ORGANISMS SEEN Performed at  [facility]  Lab, [address] [place], [place] [address]    Culture   Final    FEW STREPTOCOCCUS GORDONII NO ANAEROBES ISOLATED; CULTURE IN PROGRESS FOR 5 DAYS    Report Status PENDING  Incomplete   Organism ID, Bacteria STREPTOCOCCUS GORDONII  Final      Susceptibility   Streptococcus gordonii - MIC*    PENICILLIN <=0.06 SENSITIVE Sensitive     CEFTRIAXONE <=0.12 SENSITIVE Sensitive     ERYTHROMYCIN <=0.12 SENSITIVE Sensitive     LEVOFLOXACIN 1 SENSITIVE Sensitive     VANCOMYCIN 0.5 SENSITIVE Sensitive     * FEW STREPTOCOCCUS GORDONII     Medications:   . amLODipine  5 mg Oral Daily  . gabapentin  200 mg Oral TID  . ibuprofen  600 mg Oral TID  . pantoprazole  40 mg Oral BID  . polyethylene glycol  17 g Oral Daily  . sodium chloride flush  3 mL Intravenous Q12H   Continuous Infusions: . sodium chloride    . penicillin g continuous IV infusion 12 Million Units (03/18/18 0136)      LOS: 5 days   Marinda Elk  [facility]  03/18/2018, 8:53 AM

## 2018-03-19 NOTE — Progress Notes (Signed)
[facility] PROGRESS NOTE    Progress Note  [name]  KZS:[id number] DOB: [birth year] DOA: 03/12/2018 PCP: Patient, No Pcp Per     Brief Narrative:   James [name] is an 38 y.o. 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  Assessment/Plan:   Epidural abscess/ Epidural intraspinal abscess Status post-emergent surgical intervention. Continue IV vancomycin and Rocephin until 04/09/2018, then switched to oral penicillin on 04/10/2018 for 1 month. Pain is currently controlled on oral ibuprofen and Neurontin. Avoid narcotics Continue Protonix for GI prophylaxis. Wound care per neurosurgery.  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  Last use was 3 days prior to admission. Continue on Neurontin 100 mg 3 times daily. Continue on ibuprofen, continue oral Protonix.  Essential hypertension: Noncompliant with his medication. Control Norvasc.   DVT prophylaxis: heparin  Family Communication:none Disposition Plan/Barrier to D/C: Will need 4 weeks of antibiotics IV in house. Code Status:     Code Status Orders  (From admission, onward)         Start     Ordered   03/13/18 0611  Full code  Continuous     03/13/18 0610        Code Status History    This patient has a current code status but no historical code status.        IV Access:    Peripheral IV   Procedures and diagnostic studies:   No results found.   Medical Consultants:    None.  Anti-Infectives:   IV vancomycin and Rocephin  Subjective:    James [name] relates his pain is controlled having regular bowel movements.  Objective:    Vitals:   03/18/18 1846 03/18/18 2041 03/19/18 0228 03/19/18 0557  BP: (!) 132/91 (!) 147/89 112/70 109/72  Pulse: 89 (!) 105 79 77  Resp: 18 18 18 18   Temp: 98.2 F (36.8 C) 98 F (36.7 C) 98 F (36.7 C) 98.2 F (36.8 C)  TempSrc: Oral Oral Oral Oral  SpO2: 100% 100% 100% 99%  Weight:      Height:        Intake/Output Summary (Last 24 hours) at 03/19/2018 0858 Last data filed at 03/19/2018 0300 Gross per 24 hour  Intake 2255.7 ml  Output -  Net 2255.7 ml   Filed Weights   03/12/18 2151  Weight: 113.4 kg    Exam: General exam: In no acute distress. Respiratory system: Good air movement and clear to auscultation. Cardiovascular system: S1 & S2 heard, RRR.  Gastrointestinal system: Abdomen is nondistended, soft and nontender.  Central nervous system: Alert and oriented. No focal neurological deficits. Extremities: No pedal edema. Skin: No rashes, lesions or ulcers    Data Reviewed:    Labs: Basic Metabolic Panel: Recent Labs  Lab 03/12/18 2148 03/13/18 0835  NA 133* 133*  K 3.9 3.5  CL 99 99  CO2 24 25  GLUCOSE 117* 135*  BUN 7 7  CREATININE 0.80 0.87  CALCIUM 8.7* 8.4*   GFR Estimated Creatinine Clearance:  145.8 mL/min (by C-G formula based on SCr of 0.87 mg/dL). Liver Function Tests: Recent Labs  Lab 03/12/18 2148  AST 28  ALT 45*  ALKPHOS 125  BILITOT 0.4  PROT 7.2  ALBUMIN 2.9*   No results for input(s): LIPASE, AMYLASE in the last 168 hours. No results for input(s): AMMONIA in the last 168 hours. Coagulation profile No results for input(s): INR, PROTIME in the last 168 hours.  CBC: Recent Labs  Lab 03/12/18 2148  WBC 8.4  NEUTROABS 5.9  HGB 10.5*  HCT 33.5*  MCV 86.6  PLT 329   Cardiac Enzymes: No results  for input(s): CKTOTAL, CKMB, CKMBINDEX, TROPONINI in the last 168 hours. BNP (last 3 results) No results for input(s): PROBNP in the last 8760 hours. CBG: No results for input(s): GLUCAP in the last 168 hours. D-Dimer: No results for input(s): DDIMER in the last 72 hours. Hgb A1c: No results for input(s): HGBA1C in the last 72 hours. Lipid Profile: No results for input(s): CHOL, HDL, LDLCALC, TRIG, CHOLHDL, LDLDIRECT in the last 72 hours. Thyroid function studies: No results for input(s): TSH, T4TOTAL, T3FREE, THYROIDAB in the last 72 hours.  Invalid input(s): FREET3 Anemia work up: No results for input(s): VITAMINB12, FOLATE, FERRITIN, TIBC, IRON, RETICCTPCT in the last 72 hours. Sepsis Labs: Recent Labs  Lab 03/12/18 2148  WBC 8.4   Microbiology Recent Results (from the past 240 hour(s))  Blood culture (routine x 2)     Status: None   Collection Time: 03/13/18  2:26 AM  Result Value Ref Range Status   Specimen Description BLOOD RIGHT ANTECUBITAL  Final   Special Requests   Final    BOTTLES DRAWN AEROBIC AND ANAEROBIC Blood Culture adequate volume   Culture   Final    NO GROWTH 5 DAYS Performed at [facility], [address], [place], [place] [address]    Report Status 03/18/2018 FINAL  Final  Blood culture (routine x 2)     Status: None   Collection Time: 03/13/18  2:27 AM  Result Value Ref Range Status   Specimen Description BLOOD RIGHT HAND  Final   Special Requests   Final    BOTTLES DRAWN AEROBIC ONLY Blood Culture results may not be optimal due to an excessive volume of blood received in culture bottles   Culture   Final    NO GROWTH 5 DAYS Performed at [facility], [address], [place], [place] [address]    Report Status 03/18/2018 FINAL  Final  Gram stain     Status: None   Collection Time: 03/13/18  3:43 AM  Result Value Ref Range Status   Specimen Description ABSCESS  Final   Special Requests THORACIC EPIDURAL AEROBIC SWAB ONLY PER DR  POOL  Final   Gram Stain   Final    FEW WBC PRESENT, PREDOMINANTLY PMN NO ORGANISMS SEEN Performed at [facility], [address], [place], [place] [address]    Report Status 03/13/2018 FINAL  Final  Aerobic/Anaerobic Culture (surgical/deep wound)     Status: None   Collection Time: 03/13/18  3:43 AM  Result Value Ref Range Status   Specimen Description ABSCESS  Final   Special Requests   Final    THORACIC EPIDURAL ONLY AEROBIC SWAB SENT PER DR POOL   Gram Stain   Final    FEW WBC PRESENT, PREDOMINANTLY PMN NO ORGANISMS SEEN    Culture   Final    FEW STREPTOCOCCUS GORDONII  NO ANAEROBES ISOLATED Performed at [facility], [address] [place], [place] [address]    Report Status 03/18/2018 FINAL  Final   Organism ID, Bacteria STREPTOCOCCUS GORDONII  Final      Susceptibility   Streptococcus gordonii - MIC*    PENICILLIN <=0.06 SENSITIVE Sensitive     CEFTRIAXONE <=0.12 SENSITIVE Sensitive     ERYTHROMYCIN <=0.12 SENSITIVE Sensitive     LEVOFLOXACIN 1 SENSITIVE Sensitive     VANCOMYCIN 0.5 SENSITIVE Sensitive     * FEW STREPTOCOCCUS GORDONII     Medications:   . amLODipine  5 mg Oral Daily  . gabapentin  200 mg Oral TID  . ibuprofen  600 mg Oral TID  . pantoprazole  40 mg Oral BID  . polyethylene glycol  17 g Oral Daily  . sodium chloride flush  3 mL Intravenous Q12H   Continuous Infusions: . sodium chloride    . penicillin g continuous IV infusion 12 Million Units (03/18/18 2239)      LOS: 6 days   James [name]  [facility]  03/19/2018, 8:58 AM

## 2018-03-20 NOTE — Progress Notes (Signed)
[facility] PROGRESS NOTE    Progress Note  James [name]  VZS:[id number] DOB: 1980-06-02 DOA: 03/12/2018 PCP: Patient, No Pcp Per     Brief Narrative:   James [name] is an 38 y.o. 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  Assessment/Plan:   Epidural abscess/ Epidural intraspinal abscess: Status post-emergent surgical intervention. Continue IV vancomycin and Rocephin until 04/09/2018, then switched to oral penicillin on 04/10/2018 for 1 month. Pain is currently controlled on oral ibuprofen and Neurontin. Avoid narcotics Wound care per neurosurgery.  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  Last use was 3 days prior to admission.  Essential hypertension: Noncompliant with his medication. Control Norvasc.   DVT prophylaxis: heparin  Family Communication:none Disposition Plan/Barrier to D/C: Will need 4 weeks of antibiotics IV in house. Code Status:     Code Status Orders  (From admission, onward)         Start     Ordered   03/13/18 0611  Full code  Continuous     03/13/18 0610        Code Status History    This patient has a current code status but no historical code status.        IV Access:    Peripheral IV   Procedures and diagnostic studies:   No results found.   Medical Consultants:    None.  Anti-Infectives:   IV vancomycin and Rocephin  Subjective:     James [name] relates his pain is controlled having regular bowel movements.  Objective:    Vitals:   03/19/18 1438 03/19/18 2048 03/20/18 0146 03/20/18 0427  BP: 134/81 (!) 143/87 111/79 (!) 122/95  Pulse: 88 93 75 80  Resp:  18 18   Temp: 97.7 F (36.5 C) 97.7 F (36.5 C) 97.9 F (36.6 C) 97.6 F (36.4 C)  TempSrc: Oral Oral Oral Oral  SpO2: 100% 100% 100% 100%  Weight:      Height:        Intake/Output Summary (Last 24 hours) at 03/20/2018 0936 Last data filed at 03/19/2018 1500 Gross per 24 hour  Intake 500.4 ml  Output -  Net 500.4 ml   Filed Weights   03/12/18 2151  Weight: 113.4 kg    Exam: General exam: In no acute distress. Respiratory system: Good air movement and clear to auscultation. Cardiovascular system: S1 & S2 heard, RRR.  Gastrointestinal system: Abdomen is nondistended, soft and nontender.  Central nervous system: Alert and oriented. No focal neurological deficits. Extremities: No pedal edema. Skin: No rashes, lesions or ulcers    Data Reviewed:    Labs: Basic Metabolic Panel: No results for input(s): NA, K, CL, CO2, GLUCOSE, BUN, CREATININE, CALCIUM, MG, PHOS in the last 168 hours. GFR Estimated Creatinine Clearance: 145.8 mL/min (by C-G formula based on SCr of 0.87 mg/dL). Liver Function Tests: No results for input(s): AST, ALT, ALKPHOS, BILITOT, PROT, ALBUMIN in the last 168 hours. No results for input(s): LIPASE, AMYLASE in the last 168 hours. No results  for input(s): AMMONIA in the last 168 hours. Coagulation profile No results for input(s): INR, PROTIME in the last 168 hours.  CBC: No results for input(s): WBC, NEUTROABS, HGB, HCT, MCV, PLT in the last 168 hours. Cardiac Enzymes: No results for input(s): CKTOTAL, CKMB, CKMBINDEX, TROPONINI in the last 168 hours. BNP (last 3 results) No results for input(s): PROBNP in the last 8760 hours. CBG: No results for input(s): GLUCAP in the last 168 hours. D-Dimer: No results for  input(s): DDIMER in the last 72 hours. Hgb A1c: No results for input(s): HGBA1C in the last 72 hours. Lipid Profile: No results for input(s): CHOL, HDL, LDLCALC, TRIG, CHOLHDL, LDLDIRECT in the last 72 hours. Thyroid function studies: No results for input(s): TSH, T4TOTAL, T3FREE, THYROIDAB in the last 72 hours.  Invalid input(s): FREET3 Anemia work up: No results for input(s): VITAMINB12, FOLATE, FERRITIN, TIBC, IRON, RETICCTPCT in the last 72 hours. Sepsis Labs: No results for input(s): PROCALCITON, WBC, LATICACIDVEN in the last 168 hours. Microbiology Recent Results (from the past 240 hour(s))  Blood culture (routine x 2)     Status: None   Collection Time: 03/13/18  2:26 AM  Result Value Ref Range Status   Specimen Description BLOOD RIGHT ANTECUBITAL  Final   Special Requests   Final    BOTTLES DRAWN AEROBIC AND ANAEROBIC Blood Culture adequate volume   Culture   Final    NO GROWTH 5 DAYS Performed at [facility], [address] [place], [place] [address]    Report Status 03/18/2018 FINAL  Final  Blood culture (routine x 2)     Status: None   Collection Time: 03/13/18  2:27 AM  Result Value Ref Range Status   Specimen Description BLOOD RIGHT HAND  Final   Special Requests   Final    BOTTLES DRAWN AEROBIC ONLY Blood Culture results may not be optimal due to an excessive volume of blood received in culture bottles   Culture   Final    NO GROWTH 5 DAYS Performed at [facility], [address], [place], [place] [address]    Report Status 03/18/2018 FINAL  Final  Gram stain     Status: None   Collection Time: 03/13/18  3:43 AM  Result Value Ref Range Status   Specimen Description ABSCESS  Final   Special Requests THORACIC EPIDURAL AEROBIC SWAB ONLY PER DR POOL  Final   Gram Stain   Final    FEW WBC PRESENT, PREDOMINANTLY PMN NO ORGANISMS SEEN Performed at [facility], [address], [place], [place] [address]    Report Status 03/13/2018 FINAL   Final  Aerobic/Anaerobic Culture (surgical/deep wound)     Status: None   Collection Time: 03/13/18  3:43 AM  Result Value Ref Range Status   Specimen Description ABSCESS  Final   Special Requests   Final    THORACIC EPIDURAL ONLY AEROBIC SWAB SENT PER DR POOL   Gram Stain   Final    FEW WBC PRESENT, PREDOMINANTLY PMN NO ORGANISMS SEEN    Culture   Final    FEW STREPTOCOCCUS GORDONII NO ANAEROBES ISOLATED Performed at [facility], [address], [place], [place] [address]    Report Status 03/18/2018 FINAL  Final   Organism ID, Bacteria STREPTOCOCCUS GORDONII  Final      Susceptibility   Streptococcus gordonii - MIC*    PENICILLIN <=0.06 SENSITIVE Sensitive     CEFTRIAXONE <=0.12 SENSITIVE Sensitive  ERYTHROMYCIN <=0.12 SENSITIVE Sensitive     LEVOFLOXACIN 1 SENSITIVE Sensitive     VANCOMYCIN 0.5 SENSITIVE Sensitive     * FEW STREPTOCOCCUS GORDONII     Medications:   . amLODipine  5 mg Oral Daily  . gabapentin  200 mg Oral TID  . ibuprofen  600 mg Oral TID  . pantoprazole  40 mg Oral BID  . polyethylene glycol  17 g Oral Daily  . sodium chloride flush  3 mL Intravenous Q12H   Continuous Infusions: . sodium chloride    . penicillin g continuous IV infusion 12 Million Units (03/19/18 2232)      LOS: 7 days   Marinda Elk  [facility]  03/20/2018, 9:36 AM

## 2018-03-20 NOTE — Plan of Care (Signed)
  Problem: Skin Integrity: Goal: Demonstration of wound healing without infection will improve Outcome: Progressing   Problem: Clinical Measurements: Goal: Will remain free from infection Outcome: Progressing Goal: Diagnostic test results will improve Outcome: Progressing   Problem: Coping: Goal: Level of anxiety will decrease Outcome: Progressing   Problem: Pain Managment: Goal: General experience of comfort will improve Outcome: Progressing   Problem: Skin Integrity: Goal: Risk for impaired skin integrity will decrease Outcome: Progressing

## 2018-03-21 NOTE — Progress Notes (Signed)
[facility] PROGRESS NOTE    Progress Note  James [name]  TRZ:[id number] DOB: [birth year] DOA: 03/12/2018 PCP: Patient, No Pcp Per     Brief Narrative:   James [name] is an 38 y.o. 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  Assessment/Plan:   Epidural abscess/ Epidural intraspinal abscess: Status post-emergent surgical intervention. Continue IV vancomycin and Rocephin until 04/09/2018, then switched to oral penicillin on 04/10/2018 for 1 month. Pain is currently controlled on oral ibuprofen and Neurontin. Avoid narcotics Wound care per neurosurgery.  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  Last use was 3 days prior to admission.  Essential hypertension: Noncompliant with his medication. Control Norvasc.   DVT prophylaxis: heparin  Family Communication:none Disposition Plan/Barrier to D/C: Will need 4 weeks of antibiotics IV in house. Code Status:     Code Status Orders  (From admission, onward)         Start     Ordered   03/13/18 0611  Full code  Continuous     03/13/18 0610        Code Status History    This patient has a current code status but no historical code status.        IV Access:    Peripheral IV   Procedures and diagnostic studies:   No results found.   Medical Consultants:    None.  Anti-Infectives:   IV vancomycin and Rocephin  Subjective:     James [name] relates his pain is controlled having regular bowel movements.  Objective:    Vitals:   03/20/18 1440 03/20/18 2138 03/21/18 0250 03/21/18 0642  BP: 137/80 121/86 120/77 121/75  Pulse: 96 92 82 81  Resp: 18 18 18 20   Temp: 97.9 F (36.6 C) 97.8 F (36.6 C) 98.3 F (36.8 C) 97.7 F (36.5 C)  TempSrc: Oral Oral Oral Oral  SpO2: 98% 100% 99% 100%  Weight:      Height:        Intake/Output Summary (Last 24 hours) at 03/21/2018 1007 Last data filed at 03/20/2018 2138 Gross per 24 hour  Intake 240 ml  Output -  Net 240 ml   Filed Weights   03/12/18 2151  Weight: 113.4 kg    Exam: General exam: In no acute distress. Respiratory system: Good air movement and clear to auscultation. Cardiovascular system: S1 & S2 heard, RRR.  Gastrointestinal system: Abdomen is nondistended, soft and nontender.  Central nervous system: Alert and oriented. No focal neurological deficits. Extremities: No pedal edema. Skin: No rashes, lesions or ulcers    Data Reviewed:    Labs: Basic Metabolic Panel: No results for input(s): NA, K, CL, CO2, GLUCOSE, BUN, CREATININE, CALCIUM, MG, PHOS in the last 168 hours. GFR Estimated Creatinine Clearance: 145.8 mL/min (by C-G formula based on SCr of 0.87 mg/dL). Liver Function Tests: No results for input(s): AST, ALT, ALKPHOS, BILITOT, PROT, ALBUMIN in the last 168 hours. No results for input(s): LIPASE, AMYLASE in the last 168 hours. No results for input(s):  AMMONIA in the last 168 hours. Coagulation profile No results for input(s): INR, PROTIME in the last 168 hours.  CBC: No results for input(s): WBC, NEUTROABS, HGB, HCT, MCV, PLT in the last 168 hours. Cardiac Enzymes: No results for input(s): CKTOTAL, CKMB, CKMBINDEX, TROPONINI in the last 168 hours. BNP (last 3 results) No results for input(s): PROBNP in the last 8760 hours. CBG: No results for input(s): GLUCAP in the last 168 hours. D-Dimer: No results for input(s):  DDIMER in the last 72 hours. Hgb A1c: No results for input(s): HGBA1C in the last 72 hours. Lipid Profile: No results for input(s): CHOL, HDL, LDLCALC, TRIG, CHOLHDL, LDLDIRECT in the last 72 hours. Thyroid function studies: No results for input(s): TSH, T4TOTAL, T3FREE, THYROIDAB in the last 72 hours.  Invalid input(s): FREET3 Anemia work up: No results for input(s): VITAMINB12, FOLATE, FERRITIN, TIBC, IRON, RETICCTPCT in the last 72 hours. Sepsis Labs: No results for input(s): PROCALCITON, WBC, LATICACIDVEN in the last 168 hours. Microbiology Recent Results (from the past 240 hour(s))  Blood culture (routine x 2)     Status: None   Collection Time: 03/13/18  2:26 AM  Result Value Ref Range Status   Specimen Description BLOOD RIGHT ANTECUBITAL  Final   Special Requests   Final    BOTTLES DRAWN AEROBIC AND ANAEROBIC Blood Culture adequate volume   Culture   Final    NO GROWTH 5 DAYS Performed at [facility], [address], [place], [place] [address]    Report Status 03/18/2018 FINAL  Final  Blood culture (routine x 2)     Status: None   Collection Time: 03/13/18  2:27 AM  Result Value Ref Range Status   Specimen Description BLOOD RIGHT HAND  Final   Special Requests   Final    BOTTLES DRAWN AEROBIC ONLY Blood Culture results may not be optimal due to an excessive volume of blood received in culture bottles   Culture   Final    NO GROWTH 5 DAYS Performed at [facility], [address] [place], [place] [address]    Report Status 03/18/2018 FINAL  Final  Gram stain     Status: None   Collection Time: 03/13/18  3:43 AM  Result Value Ref Range Status   Specimen Description ABSCESS  Final   Special Requests THORACIC EPIDURAL AEROBIC SWAB ONLY PER DR POOL  Final   Gram Stain   Final    FEW WBC PRESENT, PREDOMINANTLY PMN NO ORGANISMS SEEN Performed at [facility], [address] [place], [place] [address]    Report Status 03/13/2018 FINAL  Final   Aerobic/Anaerobic Culture (surgical/deep wound)     Status: None   Collection Time: 03/13/18  3:43 AM  Result Value Ref Range Status   Specimen Description ABSCESS  Final   Special Requests   Final    THORACIC EPIDURAL ONLY AEROBIC SWAB SENT PER DR POOL   Gram Stain   Final    FEW WBC PRESENT, PREDOMINANTLY PMN NO ORGANISMS SEEN    Culture   Final    FEW STREPTOCOCCUS GORDONII NO ANAEROBES ISOLATED Performed at [facility], [address], [place], [place] [address]    Report Status 03/18/2018 FINAL  Final   Organism ID, Bacteria STREPTOCOCCUS GORDONII  Final      Susceptibility   Streptococcus gordonii - MIC*    PENICILLIN <=0.06 SENSITIVE Sensitive     CEFTRIAXONE <=0.12 SENSITIVE Sensitive  ERYTHROMYCIN <=0.12 SENSITIVE Sensitive     LEVOFLOXACIN 1 SENSITIVE Sensitive     VANCOMYCIN 0.5 SENSITIVE Sensitive     * FEW STREPTOCOCCUS GORDONII     Medications:   . amLODipine  5 mg Oral Daily  . gabapentin  200 mg Oral TID  . ibuprofen  600 mg Oral TID  . pantoprazole  40 mg Oral BID  . polyethylene glycol  17 g Oral Daily  . sodium chloride flush  3 mL Intravenous Q12H   Continuous Infusions: . sodium chloride    . penicillin g continuous IV infusion 12 Million Units (03/20/18 2358)      LOS: 8 days   Marinda Elk  [facility]  03/21/2018, 10:07 AM

## 2018-03-22 NOTE — Progress Notes (Signed)
[facility] PROGRESS NOTE    Progress Note  James [name]  [id number] DOB: Feb 06, 1980 DOA: 03/12/2018 PCP: Joette Catching, MD     Brief Narrative:   James [name] is an 38 y.o. 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  Assessment/Plan:   Epidural abscess/ Epidural intraspinal abscess: Status post-emergent surgical intervention. Continue IV vancomycin and Rocephin until 04/09/2018, then switched to oral penicillin on 04/10/2018 for 1 month. Pain is currently controlled on oral ibuprofen and Neurontin. Avoid narcotics Wound care per neurosurgery.  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  Last use was 3 days prior to admission.  Essential hypertension: Noncompliant with his medication. Control Norvasc.   DVT prophylaxis: heparin  Family Communication:none Disposition Plan/Barrier to D/C: Will need 4 weeks of antibiotics IV in house. Code Status:     Code Status Orders  (From admission, onward)         Start     Ordered   03/13/18 0611  Full code  Continuous     03/13/18 0610        Code Status History    This patient has a current code status but no historical code status.        IV Access:    Peripheral IV   Procedures and diagnostic studies:   No results found.   Medical Consultants:    None.  Anti-Infectives:   IV vancomycin and Rocephin  Subjective:     [name] relates his pain is controlled having regular bowel movements. He is ambulating in room , no new complaints   Objective:    Vitals:   03/21/18 0250 03/21/18 0642 03/21/18 1513 03/22/18 0527  BP: 120/77 121/75 134/87 114/73  Pulse: 82 81 88 81  Resp: 18 20 18 18   Temp: 98.3 F (36.8 C) 97.7 F (36.5 C) 98.5 F (36.9 C) 98 F (36.7 C)  TempSrc: Oral Oral Oral Oral  SpO2: 99% 100% 100% 100%  Weight:      Height:        Intake/Output Summary (Last 24 hours) at 03/22/2018 1501 Last data filed at 03/22/2018 1111 Gross per 24 hour  Intake 1468.9 ml  Output -  Net 1468.9 ml   Filed Weights   03/12/18 2151  Weight: 113.4 kg    Exam: General exam: In no acute distress. Respiratory system: Good air movement and clear to auscultation. Cardiovascular system: S1 & S2 heard, RRR.  Gastrointestinal system: Abdomen is nondistended, soft and nontender.  Central nervous system: Alert and oriented. No focal neurological deficits. Extremities: No pedal edema. Skin: No rashes, lesions or ulcers    Data Reviewed:    Labs: Basic Metabolic Panel: No results for input(s): NA, K, CL, CO2, GLUCOSE, BUN, CREATININE, CALCIUM, MG, PHOS in the last 168 hours. GFR Estimated Creatinine Clearance: 145.8 mL/min (by C-G formula based on SCr of 0.87 mg/dL). Liver Function Tests: No results for input(s): AST, ALT, ALKPHOS, BILITOT, PROT, ALBUMIN in the last 168 hours. No results for input(s): LIPASE, AMYLASE  in the last 168 hours. No results for input(s): AMMONIA in the last 168 hours. Coagulation profile No results for input(s): INR, PROTIME in the last 168 hours.  CBC: No results for input(s): WBC, NEUTROABS, HGB, HCT, MCV, PLT in the last 168 hours. Cardiac Enzymes: No results for input(s): CKTOTAL, CKMB, CKMBINDEX, TROPONINI in the last 168 hours. BNP (last 3 results) No results for input(s): PROBNP in the last 8760 hours. CBG: No results for input(s): GLUCAP in the  last 168 hours. D-Dimer: No results for input(s): DDIMER in the last 72 hours. Hgb A1c: No results for input(s): HGBA1C in the last 72 hours. Lipid Profile: No results for input(s): CHOL, HDL, LDLCALC, TRIG, CHOLHDL, LDLDIRECT in the last 72 hours. Thyroid function studies: No results for input(s): TSH, T4TOTAL, T3FREE, THYROIDAB in the last 72 hours.  Invalid input(s): FREET3 Anemia work up: No results for input(s): VITAMINB12, FOLATE, FERRITIN, TIBC, IRON, RETICCTPCT in the last 72 hours. Sepsis Labs: No results for input(s): PROCALCITON, WBC, LATICACIDVEN in the last 168 hours. Microbiology Recent Results (from the past 240 hour(s))  Blood culture (routine x 2)     Status: None   Collection Time: 03/13/18  2:26 AM  Result Value Ref Range Status   Specimen Description BLOOD RIGHT ANTECUBITAL  Final   Special Requests   Final    BOTTLES DRAWN AEROBIC AND ANAEROBIC Blood Culture adequate volume   Culture   Final    NO GROWTH 5 DAYS Performed at [facility], [address] [place], [place] [address]    Report Status 03/18/2018 FINAL  Final  Blood culture (routine x 2)     Status: None   Collection Time: 03/13/18  2:27 AM  Result Value Ref Range Status   Specimen Description BLOOD RIGHT HAND  Final   Special Requests   Final    BOTTLES DRAWN AEROBIC ONLY Blood Culture results may not be optimal due to an excessive volume of blood received in culture bottles   Culture   Final    NO GROWTH 5 DAYS Performed at [facility], [address] [place], [place] [address]    Report Status 03/18/2018 FINAL  Final  Gram stain     Status: None   Collection Time: 03/13/18  3:43 AM  Result Value Ref Range Status   Specimen Description ABSCESS  Final   Special Requests THORACIC EPIDURAL AEROBIC SWAB ONLY PER DR POOL  Final   Gram Stain   Final    FEW WBC PRESENT, PREDOMINANTLY PMN NO ORGANISMS SEEN Performed at [facility], [address] [place], [place] [address]     Report Status 03/13/2018 FINAL  Final  Aerobic/Anaerobic Culture (surgical/deep wound)     Status: None   Collection Time: 03/13/18  3:43 AM  Result Value Ref Range Status   Specimen Description ABSCESS  Final   Special Requests   Final    THORACIC EPIDURAL ONLY AEROBIC SWAB SENT PER DR POOL   Gram Stain   Final    FEW WBC PRESENT, PREDOMINANTLY PMN NO ORGANISMS SEEN    Culture   Final    FEW STREPTOCOCCUS GORDONII NO ANAEROBES ISOLATED Performed at [facility], [address], [place], [place] [address]    Report Status 03/18/2018 FINAL  Final   Organism ID, Bacteria STREPTOCOCCUS GORDONII  Final      Susceptibility   Streptococcus gordonii - MIC*    PENICILLIN <=0.06 SENSITIVE Sensitive  CEFTRIAXONE <=0.12 SENSITIVE Sensitive     ERYTHROMYCIN <=0.12 SENSITIVE Sensitive     LEVOFLOXACIN 1 SENSITIVE Sensitive     VANCOMYCIN 0.5 SENSITIVE Sensitive     * FEW STREPTOCOCCUS GORDONII     Medications:   . amLODipine  5 mg Oral Daily  . gabapentin  200 mg Oral TID  . ibuprofen  600 mg Oral TID  . pantoprazole  40 mg Oral BID  . polyethylene glycol  17 g Oral Daily  . sodium chloride flush  3 mL Intravenous Q12H   Continuous Infusions: . sodium chloride    . penicillin g continuous IV infusion 12 Million Units (03/22/18 0325)      LOS: 9 days   Albertine Grates  [facility]  03/22/2018, 3:01 PM

## 2018-03-23 NOTE — Progress Notes (Signed)
[facility] PROGRESS NOTE    Progress Note  James [name]  [id number] DOB: September 20, 1980 DOA: 03/12/2018 PCP: Joette Catching, MD     Brief Narrative:   James [name] is an 38 y.o. 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  Assessment/Plan:   Epidural abscess/ Epidural intraspinal abscess: Status post-emergent surgical intervention. Continue IV vancomycin and Rocephin until 04/09/2018, then switched to oral penicillin on 04/10/2018 for 1 month. Pain is currently controlled on oral ibuprofen and Neurontin. Avoid narcotics Wound care per neurosurgery.  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  Last use was 3 days prior to admission.  Essential hypertension: Noncompliant with his medication. Control Norvasc.   DVT prophylaxis: heparin  Family Communication:none Disposition Plan/Barrier to D/C: Will need 4 weeks of antibiotics IV in house. Code Status:     Code Status Orders  (From admission, onward)         Start     Ordered   03/13/18 0611  Full code  Continuous     03/13/18 0610        Code Status History    This patient has a current code status but no historical code status.        IV Access:    Peripheral IV   Procedures and diagnostic studies:   No results found.   Medical Consultants:    None.  Anti-Infectives:   IV vancomycin and Rocephin  Subjective:     [name] relates his pain is controlled having regular bowel movements. He is ambulating in room , no new complaints  He is tolerating iv abx, reports urinate a lot, no edema  Objective:    Vitals:   03/21/18 1513 03/22/18 0527 03/23/18 0600 03/23/18 1029  BP: 134/87 114/73 119/81 131/73  Pulse: 88 81  73  Resp: 18 18 16    Temp: 98.5 F (36.9 C) 98 F (36.7 C) 98.7 F (37.1 C)   TempSrc: Oral Oral    SpO2: 100% 100% 100%   Weight:      Height:        Intake/Output Summary (Last 24 hours) at 03/23/2018 1200 Last data filed at 03/23/2018 0930 Gross per 24 hour  Intake 356 ml  Output -  Net 356 ml   Filed Weights   03/12/18 2151  Weight: 113.4 kg    Exam: General exam: In no acute distress. Respiratory system: Good air movement and clear to auscultation. Cardiovascular system: S1 & S2 heard, RRR.  Gastrointestinal system: Abdomen is nondistended, soft and nontender.  Central nervous system: Alert and oriented. No focal neurological deficits. Extremities: No pedal edema. Skin: No rashes, lesions or ulcers    Data Reviewed:    Labs: Basic Metabolic Panel: No results for input(s): NA, K, CL, CO2, GLUCOSE, BUN, CREATININE, CALCIUM, MG, PHOS in the last 168 hours. GFR Estimated Creatinine Clearance: 145.8 mL/min (by C-G formula based on SCr of 0.87 mg/dL). Liver Function Tests: No results for input(s): AST, ALT, ALKPHOS, BILITOT, PROT, ALBUMIN in the last  168 hours. No results for input(s): LIPASE, AMYLASE in the last 168 hours. No results for input(s): AMMONIA in the last 168 hours. Coagulation profile No results for input(s): INR, PROTIME in the last 168 hours.  CBC: No results for input(s): WBC, NEUTROABS, HGB, HCT, MCV, PLT in the last 168 hours. Cardiac Enzymes: No results for input(s): CKTOTAL, CKMB, CKMBINDEX, TROPONINI in the last 168 hours. BNP (last 3 results) No results for input(s): PROBNP in the last 8760 hours. CBG: No results for  input(s): GLUCAP in the last 168 hours. D-Dimer: No results for input(s): DDIMER in the last 72 hours. Hgb A1c: No results for input(s): HGBA1C in the last 72 hours. Lipid Profile: No results for input(s): CHOL, HDL, LDLCALC, TRIG, CHOLHDL, LDLDIRECT in the last 72 hours. Thyroid function studies: No results for input(s): TSH, T4TOTAL, T3FREE, THYROIDAB in the last 72 hours.  Invalid input(s): FREET3 Anemia work up: No results for input(s): VITAMINB12, FOLATE, FERRITIN, TIBC, IRON, RETICCTPCT in the last 72 hours. Sepsis Labs: No results for input(s): PROCALCITON, WBC, LATICACIDVEN in the last 168 hours. Microbiology No results found for this or any previous visit (from the past 240 hour(s)).   Medications:   . amLODipine  5 mg Oral Daily  . gabapentin  200 mg Oral TID  . ibuprofen  600 mg Oral TID  . pantoprazole  40 mg Oral BID  . polyethylene glycol  17 g Oral Daily  . sodium chloride flush  3 mL Intravenous Q12H   Continuous Infusions: . sodium chloride    . penicillin g continuous IV infusion 12 Million Units (03/23/18 0337)      LOS: 10 days   Albertine Grates  [facility]  03/23/2018, 12:00 PM

## 2018-03-24 NOTE — Progress Notes (Addendum)
[facility] PROGRESS NOTE    Progress Note  James [name]  VXB:[id number] DOB: [birth year] DOA: 03/12/2018 PCP: Dione Housekeeper, MD     Brief Narrative:   James [name] is an 38 y.o. 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  Assessment/Plan:   Epidural abscess/ Epidural intraspinal abscess: Status post-   emergent T3-4 decompressive laminectomy with evacuation of epidural abscess, resection of epidural phlegmon with microdissection on 3/9 by Dr Annette Stable Continue IV vancomycin and Rocephin until 04/09/2018, then switched to oral penicillin on 04/10/2018 for 1 month. Weekly ESR/crp. Pain is currently controlled on oral ibuprofen and Neurontin. Avoid narcotics Wound care per neurosurgery.  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  Last use was 3 days prior to admission. hepc /hiv negative  Essential hypertension: Noncompliant with his medication. Control Norvasc.  Restless legs: Chronic for about 57yr, does reports history of iron deficiency He is on neurontin He is to follow up with pcp.  Consider chronic iron supplement once infection is fully treated    DVT prophylaxis: heparin  Family Communication:none Disposition Plan/Barrier to D/C: Will need 4 weeks of antibiotics IV in house., last dose on 4/5, then switch to oral abx Code Status:     Code Status Orders  (From admission, onward)        Start     Ordered   03/13/18 0611  Full code  Continuous     03/13/18 0610        Code Status History    This patient has a current code status but no historical code status.        IV Access:    Peripheral iv  picc line single lumen   Procedures and diagnostic studies:  emergent T3-4 decompressive laminectomy with evacuation of epidural abscess, resection of epidural phlegmon with microdissection onb 3/9 picc line placement on 3/11    Medical Consultants:    Neurosurgery  Infectious disease  Anti-Infectives:   IV vancomycin and Rocephin  Subjective:    James TJennette Bankerc/o restless legs , improves when he moves around  He is tolerating iv abx, reports urinate a lot, no edema  Objective:    Vitals:   03/22/18 0527 03/23/18 0600 03/23/18 1029 03/24/18 0519  BP: 114/73 119/81 131/73 121/83  Pulse: 81  73 90  Resp: _0 Temp: 98 F (36.7 C) 98.7 F (37.1 C)  98.1 F (36.7 C)  TempSrc: Oral   Oral  SpO2: 100% 100%  100%  Weight:      Height:        Intake/Output Summary (Last 24 hours) at 03/24/2018 1055 Last data filed at 03/24/2018 0252 Gross per 24 hour  Intake 400 ml  Output -  Net 400 ml   Filed Weights   03/12/18 2151  Weight: 113.4 kg    Exam: General exam: In no acute distress. Respiratory system: Good air movement and clear to auscultation. Cardiovascular system: S1 & S2 heard, RRR.  Gastrointestinal system: Abdomen is nondistended, soft  and nontender.  Central nervous system: Alert and oriented. No focal neurological deficits. Extremities: No pedal edema. Skin: No rashes, lesions or ulcers    Data Reviewed:    Labs: Basic Metabolic Panel: No results for input(s): NA, K, CL, CO2, GLUCOSE, BUN, CREATININE, CALCIUM, MG, PHOS in the last 168 hours. GFR Estimated Creatinine Clearance: 145.8 mL/min (by C-G formula based on SCr of 0.87 mg/dL). Liver Function Tests: No results for input(s): AST, ALT, ALKPHOS, BILITOT,  PROT, ALBUMIN in the last 168 hours. No results for input(s): LIPASE, AMYLASE in the last 168 hours. No results for input(s): AMMONIA in the last 168 hours. Coagulation profile No results for input(s): INR, PROTIME in the last 168 hours.  CBC: No results for input(s): WBC, NEUTROABS, HGB, HCT, MCV, PLT in the last 168 hours. Cardiac Enzymes: No results for input(s): CKTOTAL, CKMB, CKMBINDEX, TROPONINI in the last 168 hours. BNP (last 3 results) No results for input(s): PROBNP in the last 8760 hours. CBG: No results for input(s): GLUCAP in the last 168 hours. D-Dimer: No results for input(s): DDIMER in the last 72 hours. Hgb A1c: No results for input(s): HGBA1C in the last 72 hours. Lipid Profile: No results for input(s): CHOL, HDL, LDLCALC, TRIG, CHOLHDL, LDLDIRECT in the last 72 hours. Thyroid function studies: No results for input(s): TSH, T4TOTAL, T3FREE, THYROIDAB in the last 72 hours.  Invalid input(s): FREET3 Anemia work up: No results for input(s): VITAMINB12, FOLATE, FERRITIN, TIBC, IRON, RETICCTPCT in the last 72 hours. Sepsis Labs: No results for input(s): PROCALCITON, WBC, LATICACIDVEN in the last 168 hours. Microbiology No results found for this or any previous visit (from the past 240 hour(s)).   Medications:   . amLODipine  5 mg Oral Daily  . gabapentin  200 mg Oral TID  . ibuprofen  600 mg Oral TID  . pantoprazole  40 mg Oral BID  . polyethylene glycol  17 g Oral Daily  . sodium chloride flush  3 mL Intravenous Q12H   Continuous Infusions: . sodium chloride    . penicillin g continuous IV infusion 12 Million Units (03/24/18 0520)      LOS: 11 days   Fang Xu  [facility]  03/24/2018, 10:55 AM        

## 2018-03-25 NOTE — Progress Notes (Signed)
[facility] PROGRESS NOTE    Progress Note  James [name]  [id number] DOB: 1980/05/29 DOA: 03/12/2018 PCP: Dione Housekeeper, MD     Brief Narrative:   James [name] is an 38 y.o. 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  Assessment/Plan:   Epidural abscess/ Epidural intraspinal abscess: -Status post-   emergent T3-4 decompressive laminectomy with evacuation of epidural abscess, resection of epidural phlegmon with microdissection on 3/9 by Dr Annette Stable -Continue IV vancomycin and Rocephin until 04/09/2018, then switched to oral penicillin on 04/10/2018 for 1 month. Weekly ESR/crp. Pain is currently controlled on oral ibuprofen and Neurontin. Avoid narcotics Wound care per neurosurgery.  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  Last use was 3 days prior to admission. hepc /hiv negative  Essential hypertension: Noncompliant with his medication. Control Norvasc.  Restless legs: Chronic for about 11yr, does reports history of iron deficiency He is on neurontin He is to follow up with pcp.  Consider chronic iron supplement once infection is fully treated    DVT prophylaxis: heparin  Family Communication:none Disposition Plan/Barrier to D/C: Will need 4 weeks of antibiotics IV in house., last dose on 4/5, then switch to oral abx Code Status:     Code Status Orders  (From admission, onward)        Start     Ordered   03/13/18 0611  Full code  Continuous     03/13/18 0610        Code Status History    This patient has a current code status but no historical code status.        IV Access:    Peripheral iv  picc line single lumen   Procedures and diagnostic studies:  emergent T3-4 decompressive laminectomy with evacuation of epidural abscess, resection of epidural phlegmon with microdissection onb 3/9 picc line placement on 3/11    Medical Consultants:    Neurosurgery  Infectious disease  Anti-Infectives:   IV vancomycin and Rocephin  Subjective:    AHarriet Phono new complaints   He is tolerating iv abx, reports urinate a lot, no edema  Objective:    Vitals:   03/23/18 0600 03/23/18 1029 03/24/18 0519 03/25/18 0529  BP: 119/81 131/73 121/83 126/77  Pulse:  73 90 65  Resp: _0 Temp: 98.7 F (37.1 C)  98.1 F (36.7 C) 97.6 F (36.4 C)  TempSrc:   Oral Oral  SpO2: 100%  100% 100%  Weight:      Height:        Intake/Output Summary (Last 24 hours) at 03/25/2018 1031 Last data filed at 03/25/2018 04401Gross per 24 hour  Intake 1799.44 ml  Output -  Net 1799.44 ml   Filed Weights   03/12/18 2151  Weight: 113.4 kg    Exam: General exam: In no acute distress. Respiratory system: Good air movement and clear to auscultation. Cardiovascular system: S1 & S2 heard, RRR.  Gastrointestinal system: Abdomen is nondistended, soft and nontender.  Central nervous  system: Alert and oriented. No focal neurological deficits. Extremities: No pedal edema. Skin: No rashes, lesions or ulcers    Data Reviewed:    Labs: Basic Metabolic Panel: No results for input(s): NA, K, CL, CO2, GLUCOSE, BUN, CREATININE, CALCIUM, MG, PHOS in the last 168 hours. GFR Estimated Creatinine Clearance: 145.8 mL/min (by C-G formula based on SCr of 0.87 mg/dL). Liver Function Tests: No results for input(s): AST, ALT, ALKPHOS, BILITOT, PROT, ALBUMIN in the  last 168 hours. No results for input(s): LIPASE, AMYLASE in the last 168 hours. No results for input(s): AMMONIA in the last 168 hours. Coagulation profile No results for input(s): INR, PROTIME in the last 168 hours.  CBC: No results for input(s): WBC, NEUTROABS, HGB, HCT, MCV, PLT in the last 168 hours. Cardiac Enzymes: No results for input(s): CKTOTAL, CKMB, CKMBINDEX, TROPONINI in the last 168 hours. BNP (last 3 results) No results for input(s): PROBNP in the last 8760 hours. CBG: No results for input(s): GLUCAP in the last 168 hours. D-Dimer: No results for input(s): DDIMER in the last 72 hours. Hgb A1c: No results for input(s): HGBA1C in the last 72 hours. Lipid Profile: No results for input(s): CHOL, HDL, LDLCALC, TRIG, CHOLHDL, LDLDIRECT in the last 72 hours. Thyroid function studies: No results for input(s): TSH, T4TOTAL, T3FREE, THYROIDAB in the last 72 hours.  Invalid input(s): FREET3 Anemia work up: No results for input(s): VITAMINB12, FOLATE, FERRITIN, TIBC, IRON, RETICCTPCT in the last 72 hours. Sepsis Labs: No results for input(s): PROCALCITON, WBC, LATICACIDVEN in the last 168 hours. Microbiology No results found for this or any previous visit (from the past 240 hour(s)).   Medications:   . amLODipine  5 mg Oral Daily  . gabapentin  200 mg Oral TID  . ibuprofen  600 mg Oral TID  . pantoprazole  40 mg Oral BID  . polyethylene glycol  17 g Oral Daily  . sodium chloride flush  3 mL Intravenous Q12H   Continuous Infusions: . sodium chloride    . penicillin g continuous IV infusion 12 Million Units (03/25/18 0626)      LOS: 12 days   Florencia Reasons  [facility]  03/25/2018, 10:31 AM

## 2018-03-26 NOTE — Progress Notes (Signed)
[facility] PROGRESS NOTE    Progress Note  James [name]  MRN:[id number] DOB: 06/12/1980 DOA: 03/12/2018 PCP: Nyland, Leonard, MD     Brief Narrative:   James [name] is an 37 y.o. 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  Assessment/Plan:   Epidural abscess/ Epidural intraspinal abscess: -Status post-   emergent T3-4 decompressive laminectomy with evacuation of epidural abscess, resection of epidural phlegmon with microdissection on 3/9 by Dr Pool -Continue IV vancomycin and Rocephin until 04/09/2018, then switched to oral penicillin on 04/10/2018 for 1 month. Weekly ESR/crp. Pain is currently controlled on oral ibuprofen and Neurontin. Avoid narcotics Wound care per neurosurgery.  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  Last use was 3 days prior to admission. hepc /hiv negative  Essential hypertension: Noncompliant with his medication. Control Norvasc.  Restless legs: Chronic for about 2yrs, does reports history of iron deficiency He is on neurontin He is to follow up with pcp.  Consider chronic iron supplement once infection is fully treated    DVT prophylaxis: heparin  Family Communication:none Disposition Plan/Barrier to D/C: Will need 4 weeks of antibiotics IV in house., last dose on 4/5, then switch to oral abx Code Status:     Code Status Orders  (From admission, onward)        Start     Ordered   03/13/18 0611  Full code  Continuous     03/13/18 0610        Code Status History    This patient has a current code status but no historical code status.        IV Access:    Peripheral iv  picc line single lumen   Procedures and diagnostic studies:  emergent T3-4 decompressive laminectomy with evacuation of epidural abscess, resection of epidural phlegmon with microdissection onb 3/9 picc line placement on 3/11    Medical Consultants:    Neurosurgery  Infectious disease  Anti-Infectives:   IV vancomycin and Rocephin  Subjective:    James [name] no new complaints   He is tolerating iv abx, reports urinate a lot, no edema  Objective:    Vitals:   03/23/18 1029 03/24/18 0519 03/25/18 0529 03/26/18 0528  BP: 131/73 121/83 126/77 115/78  Pulse: 73 90 65 (!) 56  Resp:  15 15 18  Temp:  98.1 F (36.7 C) 97.6 F (36.4 C) 98.2 F (36.8 C)  TempSrc:  Oral Oral Oral  SpO2:  100% 100% 99%  Weight:      Height:        Intake/Output Summary (Last 24 hours) at 03/26/2018 0759 Last data filed at 03/26/2018 0633 Gross per 24 hour  Intake 2205.86 ml  Output -  Net 2205.86 ml   Filed Weights   03/12/18 2151  Weight: 113.4 kg    Exam: General exam: In no acute distress. Respiratory system: Good air movement and clear to auscultation. Cardiovascular system: S1 & S2 heard, RRR.  Gastrointestinal system: Abdomen is nondistended, soft and nontender.  Central   nervous system: Alert and oriented. No focal neurological deficits. Extremities: No pedal edema. Skin: No rashes, lesions or ulcers    Data Reviewed:    Labs: Basic Metabolic Panel: No results for input(s): NA, K, CL, CO2, GLUCOSE, BUN, CREATININE, CALCIUM, MG, PHOS in the last 168 hours. GFR Estimated Creatinine Clearance: 145.8 mL/min (by C-G formula based on SCr of 0.87 mg/dL). Liver Function Tests: No results for input(s): AST, ALT, ALKPHOS, BILITOT, PROT, ALBUMIN  in the last 168 hours. No results for input(s): LIPASE, AMYLASE in the last 168 hours. No results for input(s): AMMONIA in the last 168 hours. Coagulation profile No results for input(s): INR, PROTIME in the last 168 hours.  CBC: No results for input(s): WBC, NEUTROABS, HGB, HCT, MCV, PLT in the last 168 hours. Cardiac Enzymes: No results for input(s): CKTOTAL, CKMB, CKMBINDEX, TROPONINI in the last 168 hours. BNP (last 3 results) No results for input(s): PROBNP in the last 8760 hours. CBG: No results for input(s): GLUCAP in the last 168 hours. D-Dimer: No results for input(s): DDIMER in the last 72 hours. Hgb A1c: No results for input(s): HGBA1C in the last 72 hours. Lipid Profile: No results for input(s): CHOL, HDL, LDLCALC, TRIG, CHOLHDL, LDLDIRECT in the last 72 hours. Thyroid function studies: No results for input(s): TSH, T4TOTAL, T3FREE, THYROIDAB in the last 72 hours.  Invalid input(s): FREET3 Anemia work up: No results for input(s): VITAMINB12, FOLATE, FERRITIN, TIBC, IRON, RETICCTPCT in the last 72 hours. Sepsis Labs: No results for input(s): PROCALCITON, WBC, LATICACIDVEN in the last 168 hours. Microbiology No results found for this or any previous visit (from the past 240 hour(s)).   Medications:   . amLODipine  5 mg Oral Daily  . gabapentin  200 mg Oral TID  . ibuprofen  600 mg Oral TID  . pantoprazole  40 mg Oral BID  . polyethylene glycol  17 g Oral Daily  . sodium chloride flush  3 mL Intravenous Q12H   Continuous Infusions: . sodium chloride    . penicillin g continuous IV infusion 12 Million Units (03/26/18 0633)      LOS: 13 days   Fang Xu  [facility]  03/26/2018, 7:59 AM        

## 2018-03-26 NOTE — Plan of Care (Signed)
  Problem: Elimination: Goal: Will not experience complications related to bowel motility Outcome: Progressing   Problem: Pain Managment: Goal: General experience of comfort will improve Outcome: Progressing   Problem: Skin Integrity: Goal: Risk for impaired skin integrity will decrease Outcome: Progressing   

## 2018-03-27 NOTE — Progress Notes (Signed)
[facility] PROGRESS NOTE    Progress Note  James [name]  [id number]:[id number] DOB: [birth year] DOA: 03/12/2018 PCP: Dione Housekeeper, MD     Brief Narrative:   James [name] is an 38 y.o. 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  Assessment/Plan:   Epidural abscess/ Epidural intraspinal abscess: -Status post-   emergent T3-4 decompressive laminectomy with evacuation of epidural abscess, resection of epidural phlegmon with microdissection on 3/9 by Dr Annette Stable -Continue IV vancomycin and Rocephin until 04/09/2018, then switched to oral penicillin on 04/10/2018 for 1 month. Weekly ESR/crp. Pain is currently controlled on oral ibuprofen and Neurontin. Avoid narcotics Wound care per neurosurgery.  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  Last use was 3 days prior to admission. hepc /hiv negative  Essential hypertension: Noncompliant with his medication. Control Norvasc.  Restless legs: Chronic for about 89yr, does reports history of iron deficiency He is on neurontin He is to follow up with pcp.  Consider chronic iron supplement once infection is fully treated    DVT prophylaxis: heparin  Family Communication:none Disposition Plan/Barrier to D/C: Will need 4 weeks of antibiotics IV in house., last dose on 4/5, then switch to oral abx Code Status:     Code Status Orders  (From admission, onward)        Start     Ordered   03/13/18 0611  Full code  Continuous     03/13/18 0610        Code Status History    This patient has a current code status but no historical code status.        IV Access:    Peripheral iv  picc line single lumen   Procedures and diagnostic studies:  emergent T3-4 decompressive laminectomy with evacuation of epidural abscess, resection of epidural phlegmon with microdissection onb 3/9 picc line placement on 3/11    Medical Consultants:    Neurosurgery  Infectious disease  Anti-Infectives:   IV vancomycin and Rocephin  Subjective:    AHarriet Phono new complaints   He is tolerating iv abx, reports urinate a lot, no edema  Objective:    Vitals:   03/24/18 0519 03/25/18 0529 03/26/18 0528 03/27/18 0517  BP: 121/83 126/77 115/78 120/89  Pulse: 90 65 (!) 56 91  Resp: '15 15 18 18  '$ Temp: 98.1 F (36.7 C) 97.6 F (36.4 C) 98.2 F (36.8 C) 98.2 F (36.8 C)  TempSrc: Oral Oral Oral Oral  SpO2: 100% 100% 99% 98%  Weight:      Height:        Intake/Output Summary (Last 24 hours) at 03/27/2018 0757 Last data filed at 03/27/2018 0300 Gross per 24 hour  Intake 1712.68 ml  Output -  Net 1712.68 ml   Filed Weights   03/12/18 2151  Weight: 113.4 kg    Exam: General exam: In no acute distress. Respiratory system: Good air movement and clear to auscultation. Cardiovascular system: S1 & S2 heard, RRR.  Gastrointestinal system: Abdomen is nondistended, soft and  nontender.  Central nervous system: Alert and oriented. No focal neurological deficits. Extremities: No pedal edema. Skin: No rashes, lesions or ulcers    Data Reviewed:    Labs: Basic Metabolic Panel: No results for input(s): NA, K, CL, CO2, GLUCOSE, BUN, CREATININE, CALCIUM, MG, PHOS in the last 168 hours. GFR Estimated Creatinine Clearance: 145.8 mL/min (by C-G formula based on SCr of 0.87 mg/dL). Liver Function Tests: No results for input(s): AST, ALT,  ALKPHOS, BILITOT, PROT, ALBUMIN in the last 168 hours. No results for input(s): LIPASE, AMYLASE in the last 168 hours. No results for input(s): AMMONIA in the last 168 hours. Coagulation profile No results for input(s): INR, PROTIME in the last 168 hours.  CBC: No results for input(s): WBC, NEUTROABS, HGB, HCT, MCV, PLT in the last 168 hours. Cardiac Enzymes: No results for input(s): CKTOTAL, CKMB, CKMBINDEX, TROPONINI in the last 168 hours. BNP (last 3 results) No results for input(s): PROBNP in the last 8760 hours. CBG: No results for input(s): GLUCAP in the last 168 hours. D-Dimer: No results for input(s): DDIMER in the last 72 hours. Hgb A1c: No results for input(s): HGBA1C in the last 72 hours. Lipid Profile: No results for input(s): CHOL, HDL, LDLCALC, TRIG, CHOLHDL, LDLDIRECT in the last 72 hours. Thyroid function studies: No results for input(s): TSH, T4TOTAL, T3FREE, THYROIDAB in the last 72 hours.  Invalid input(s): FREET3 Anemia work up: No results for input(s): VITAMINB12, FOLATE, FERRITIN, TIBC, IRON, RETICCTPCT in the last 72 hours. Sepsis Labs: No results for input(s): PROCALCITON, WBC, LATICACIDVEN in the last 168 hours. Microbiology No results found for this or any previous visit (from the past 240 hour(s)).   Medications:   . amLODipine  5 mg Oral Daily  . gabapentin  200 mg Oral TID  . ibuprofen  600 mg Oral TID  . pantoprazole  40 mg Oral BID  . polyethylene glycol  17 g Oral Daily  . sodium chloride flush  3 mL Intravenous Q12H   Continuous Infusions: . sodium chloride    . penicillin g continuous IV infusion 12 Million Units (03/27/18 3154)      LOS: 14 days   Florencia Reasons  [facility]  03/27/2018, 7:57 AM    [facility] PROGRESS NOTE    Progress Note  James [name]  [id number]:[id number] DOB: 30-Nov-1980 DOA: 03/12/2018 PCP: Dione Housekeeper, MD     Brief Narrative:   James [name] is an 38 y.o. 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  Assessment/Plan:   Epidural abscess/ Epidural intraspinal abscess: -Status post-   emergent T3-4 decompressive laminectomy with evacuation of epidural abscess, resection of epidural phlegmon with microdissection on 3/9 by Dr Annette Stable -Continue IV vancomycin and Rocephin until 04/09/2018, then switched to oral penicillin on 04/10/2018 for 1 month. Weekly ESR/crp. Pain is currently controlled on oral ibuprofen and Neurontin. Avoid narcotics Wound care per neurosurgery.  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  Last use was 3 days prior to admission. hepc /hiv negative  Essential hypertension:  Noncompliant with his medication. Control Norvasc.  Restless legs: Chronic for about 30yr, does reports history of iron deficiency He is on neurontin He is to follow up with pcp.  Consider chronic iron supplement once infection is fully treated    DVT prophylaxis: heparin  Family Communication:none Disposition Plan/Barrier to D/C: Will need 4 weeks of antibiotics IV in house., last dose on 4/5, then switch to oral abx Code Status:     Code Status Orders  (From admission, onward)         Start     Ordered   03/13/18 0611  Full code  Continuous     03/13/18 0610        Code Status History    This patient has a current code status but no historical code status.        IV Access:    Peripheral iv  picc  line single lumen   Procedures and diagnostic studies:  emergent T3-4 decompressive laminectomy with evacuation of epidural abscess, resection of epidural phlegmon with microdissection onb 3/9 picc line placement on 3/11    Medical Consultants:    Neurosurgery  Infectious disease  Anti-Infectives:   IV vancomycin and Rocephin  Subjective:    AHarriet Phono new complaints   He is tolerating iv abx, reports urinate a lot, no edema  Objective:    Vitals:   03/24/18 0519 03/25/18 0529 03/26/18 0528 03/27/18 0517  BP: 121/83 126/77 115/78 120/89  Pulse: 90 65 (!) 56 91  Resp: '15 15 18 18  '$ Temp: 98.1 F (36.7 C) 97.6 F (36.4 C) 98.2 F (36.8 C) 98.2 F (36.8 C)  TempSrc: Oral Oral Oral Oral  SpO2: 100% 100% 99% 98%  Weight:      Height:        Intake/Output Summary (Last 24 hours) at 03/27/2018 0758 Last data filed at 03/27/2018 0300 Gross per 24 hour  Intake 1712.68 ml  Output -  Net 1712.68 ml   Filed Weights   03/12/18 2151  Weight: 113.4 kg    Exam: General exam: In no acute distress. Respiratory system: Good air movement and clear to auscultation. Cardiovascular system: S1 & S2 heard, RRR.  Gastrointestinal system: Abdomen is nondistended, soft and nontender.  Central nervous system: Alert and oriented. No focal neurological deficits. Extremities: No pedal edema. Skin: No rashes, lesions or ulcers    Data Reviewed:    Labs: Basic Metabolic Panel: No results for input(s): NA, K, CL, CO2, GLUCOSE, BUN, CREATININE, CALCIUM, MG, PHOS in the last 168 hours. GFR Estimated Creatinine Clearance: 145.8 mL/min (by C-G formula based on SCr of 0.87 mg/dL). Liver Function Tests: No results for input(s): AST, ALT, ALKPHOS, BILITOT, PROT, ALBUMIN in the last 168 hours. No results for input(s): LIPASE, AMYLASE in the last 168 hours. No results for input(s): AMMONIA in the last 168 hours. Coagulation profile No results for input(s): INR, PROTIME in  the last 168 hours.  CBC: No results for input(s): WBC, NEUTROABS, HGB, HCT, MCV, PLT in the last 168 hours. Cardiac Enzymes: No results for input(s): CKTOTAL, CKMB, CKMBINDEX, TROPONINI in the last 168 hours. BNP (last 3 results) No results for input(s): PROBNP in the last 8760 hours. CBG: No results for input(s): GLUCAP in the last 168 hours. D-Dimer: No results for input(s): DDIMER in the last 72 hours. Hgb A1c: No results for input(s): HGBA1C in the last 72 hours. Lipid Profile: No results for input(s): CHOL, HDL, LDLCALC, TRIG, CHOLHDL, LDLDIRECT in  the last 72 hours. Thyroid function studies: No results for input(s): TSH, T4TOTAL, T3FREE, THYROIDAB in the last 72 hours.  Invalid input(s): FREET3 Anemia work up: No results for input(s): VITAMINB12, FOLATE, FERRITIN, TIBC, IRON, RETICCTPCT in the last 72 hours. Sepsis Labs: No results for input(s): PROCALCITON, WBC, LATICACIDVEN in the last 168 hours. Microbiology No results found for this or any previous visit (from the past 240 hour(s)).   Medications:   . amLODipine  5 mg Oral Daily  . gabapentin  200 mg Oral TID  . ibuprofen  600 mg Oral TID  . pantoprazole  40 mg Oral BID  . polyethylene glycol  17 g Oral Daily  . sodium chloride flush  3 mL Intravenous Q12H   Continuous Infusions: . sodium chloride    . penicillin g continuous IV infusion 12 Million Units (03/27/18 9971)      LOS: 14 days   Florencia Reasons  [facility]  03/27/2018, 7:58 AM

## 2018-03-28 NOTE — Progress Notes (Signed)
[facility] PROGRESS NOTE    Progress Note  James [name]  QPR:[id number] DOB: 07/07/1980 DOA: 03/12/2018 PCP: Dione Housekeeper, MD     Brief Narrative:   James [name] is an 38 y.o. 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  Assessment/Plan:   Epidural abscess/ Epidural intraspinal abscess: -Status post-   emergent T3-4 decompressive laminectomy with evacuation of epidural abscess, resection of epidural phlegmon with microdissection on 3/9 by Dr Annette Stable -Continue IV vancomycin and Rocephin until 04/09/2018, then switched to oral penicillin on 04/10/2018 for 1 month. Weekly ESR/crp. Pain is currently controlled on oral ibuprofen and Neurontin. Avoid narcotics Wound care per neurosurgery.  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  Last use was 3 days prior to admission. hepc /hiv negative  Essential hypertension: Noncompliant with his medication. Control Norvasc.  Restless legs: Chronic for about 30yr, does reports history of iron deficiency He is on neurontin He is to follow up with pcp.  Consider chronic iron supplement once infection is fully treated    DVT prophylaxis: heparin  Family Communication:none Disposition Plan/Barrier to D/C: Will need 4 weeks of antibiotics IV in house., last dose on 4/5, then switch to oral abx Code Status:     Code Status Orders  (From admission, onward)        Start     Ordered   03/13/18 0611  Full code  Continuous     03/13/18 0610        Code Status History    This patient has a current code status but no historical code status.        IV Access:    Peripheral iv  picc line single lumen   Procedures and diagnostic studies:  emergent T3-4 decompressive laminectomy with evacuation of epidural abscess, resection of epidural phlegmon with microdissection onb 3/9 picc line placement on 3/11    Medical Consultants:    Neurosurgery  Infectious disease  Anti-Infectives:   IV vancomycin and Rocephin  Subjective:    AHarriet Phono new complaints   He is tolerating iv abx, reports urinate a lot, no edema  Objective:    Vitals:   03/24/18 0519 03/25/18 0529 03/26/18 0528 03/27/18 0517  BP: 121/83 126/77 115/78 120/89  Pulse: 90 65 (!) 56 91  Resp: _0 Temp: 98.1 F (36.7 C) 97.6 F (36.4 C) 98.2 F (36.8 C) 98.2 F (36.8 C)  TempSrc: Oral Oral Oral Oral  SpO2: 100% 100% 99% 98%  Weight:      Height:       No intake or output data in the 24 hours ending 03/28/18 0809 Filed Weights   03/12/18 2151  Weight: 113.4 kg    Exam: General exam: In no acute distress. Respiratory system: Good air movement and clear to auscultation. Cardiovascular system: S1 & S2 heard, RRR.  Gastrointestinal system: Abdomen is nondistended, soft and nontender.  Central nervous system: Alert and oriented. No focal neurological deficits. Extremities: No pedal edema. Skin: No rashes, lesions  or ulcers    Data Reviewed:    Labs: Basic Metabolic Panel: No results for input(s): NA, K, CL, CO2, GLUCOSE, BUN, CREATININE, CALCIUM, MG, PHOS in the last 168 hours. GFR Estimated Creatinine Clearance: 145.8 mL/min (by C-G formula based on SCr of 0.87 mg/dL). Liver Function Tests: No results for input(s): AST, ALT, ALKPHOS, BILITOT, PROT, ALBUMIN in the last 168 hours. No results for input(s): LIPASE, AMYLASE in the  last 168 hours. No results for input(s): AMMONIA in the last 168 hours. Coagulation profile No results for input(s): INR, PROTIME in the last 168 hours.  CBC: No results for input(s): WBC, NEUTROABS, HGB, HCT, MCV, PLT in the last 168 hours. Cardiac Enzymes: No results for input(s): CKTOTAL, CKMB, CKMBINDEX, TROPONINI in the last 168 hours. BNP (last 3 results) No results for input(s): PROBNP in the last 8760 hours. CBG: No results for input(s): GLUCAP in the last 168 hours. D-Dimer: No results for input(s): DDIMER in the last 72 hours. Hgb A1c: No results for input(s): HGBA1C in the last 72 hours. Lipid Profile: No results for input(s): CHOL, HDL, LDLCALC, TRIG, CHOLHDL, LDLDIRECT in the last 72 hours. Thyroid function studies: No results for input(s): TSH, T4TOTAL, T3FREE, THYROIDAB in the last 72 hours.  Invalid input(s): FREET3 Anemia work up: No results for input(s): VITAMINB12, FOLATE, FERRITIN, TIBC, IRON, RETICCTPCT in the last 72 hours. Sepsis Labs: No results for input(s): PROCALCITON, WBC, LATICACIDVEN in the last 168 hours. Microbiology No results found for this or any previous visit (from the past 240 hour(s)).   Medications:   . amLODipine  5 mg Oral Daily  . gabapentin  200 mg Oral TID  . ibuprofen  600 mg Oral TID  . pantoprazole  40 mg Oral BID  . polyethylene glycol  17 g Oral Daily  . sodium chloride flush  3 mL Intravenous Q12H   Continuous Infusions: . sodium chloride    . penicillin g continuous IV infusion 12 Million Units (03/28/18 0649)      LOS: 15 days   Florencia Reasons  [facility]  03/28/2018, 8:09 AM    [facility] PROGRESS NOTE    Progress Note  James [name]  HUD:[id number] DOB: Sep 21, 1980 DOA: 03/12/2018 PCP: Dione Housekeeper, MD     Brief Narrative:   James [name] is an 38 y.o. male past medical history of essential hypertension on lisinopril chronic back pain issues for last Saturday started having increased  back pain was found to have in the ED and epidural abscess with intraspinal abscess and third rib osteomyelitis underwent emergency decompressive laminectomy with abscess evacuation blood cultures ordered started empirically on IV vancomycin and Rocephin, infectious disease was consulted, medicine was consulted for essential hypertension and drug abuse, ID was consulted they recommended IV empiric antibiotic in-house for 4 weeks, then a month with oral amoxicillin.  Assessment/Plan:   Epidural abscess/ Epidural intraspinal abscess: -Status post-   emergent T3-4 decompressive laminectomy with evacuation of epidural abscess, resection of epidural phlegmon with microdissection on 3/9 by Dr Annette Stable -Continue IV vancomycin and Rocephin until 04/09/2018, then switched to oral penicillin on 04/10/2018 for 1 month. Weekly ESR/crp. Pain is currently controlled on oral ibuprofen and Neurontin. Avoid narcotics Wound care per neurosurgery.  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  Last use was 3 days prior to admission. hepc /hiv negative  Essential hypertension: Noncompliant with his medication. Control Norvasc.  Restless legs: Chronic for about 23yr, does reports history of iron deficiency He  is on neurontin He is to follow up with pcp.  Consider chronic iron supplement once infection is fully treated    DVT prophylaxis: heparin  Family Communication:none Disposition Plan/Barrier to D/C: Will need 4 weeks of antibiotics IV in house., last dose on 4/5, then switch to oral abx Code Status:     Code Status Orders  (From admission, onward)         Start     Ordered   03/13/18 0611  Full code  Continuous     03/13/18 0610        Code Status History    This patient has a current code status but no historical code status.        IV Access:    Peripheral iv  picc line single lumen   Procedures and diagnostic studies:  emergent T3-4 decompressive laminectomy with  evacuation of epidural abscess, resection of epidural phlegmon with microdissection onb 3/9 picc line placement on 3/11    Medical Consultants:    Neurosurgery  Infectious disease  Anti-Infectives:   IV vancomycin and Rocephin  Subjective:    Harriet Pho no new complaints   He is tolerating iv abx, reports urinate a lot, no edema  Objective:    Vitals:   03/24/18 0519 03/25/18 0529 03/26/18 0528 03/27/18 0517  BP: 121/83 126/77 115/78 120/89  Pulse: 90 65 (!) 56 91  Resp: _0 Temp: 98.1 F (36.7 C) 97.6 F (36.4 C) 98.2 F (36.8 C) 98.2 F (36.8 C)  TempSrc: Oral Oral Oral Oral  SpO2: 100% 100% 99% 98%  Weight:      Height:       No intake or output data in the 24 hours ending 03/28/18 0809 Filed Weights   03/12/18 2151  Weight: 113.4 kg    Exam: General exam: In no acute distress. Respiratory system: Good air movement and clear to auscultation. Cardiovascular system: S1 & S2 heard, RRR.  Gastrointestinal system: Abdomen is nondistended, soft and nontender.  Central nervous system: Alert and oriented. No focal neurological deficits. Extremities: No pedal edema. Skin: No rashes, lesions or ulcers    Data Reviewed:    Labs: Basic Metabolic Panel: No results for input(s): NA, K, CL, CO2, GLUCOSE, BUN, CREATININE, CALCIUM, MG, PHOS in the last 168 hours. GFR Estimated Creatinine Clearance: 145.8 mL/min (by C-G formula based on SCr of 0.87 mg/dL). Liver Function Tests: No results for input(s): AST, ALT, ALKPHOS, BILITOT, PROT, ALBUMIN in the last 168 hours. No results for input(s): LIPASE, AMYLASE in the last 168 hours. No results for input(s): AMMONIA in the last 168 hours. Coagulation profile No results for input(s): INR, PROTIME in the last 168 hours.  CBC: No results for input(s): WBC, NEUTROABS, HGB, HCT, MCV, PLT in the last 168 hours. Cardiac Enzymes: No results for input(s): CKTOTAL, CKMB, CKMBINDEX, TROPONINI in the last  168 hours. BNP (last 3 results) No results for input(s): PROBNP in the last 8760 hours. CBG: No results for input(s): GLUCAP in the last 168 hours. D-Dimer: No results for input(s): DDIMER in the last 72 hours. Hgb A1c: No results for input(s): HGBA1C in the last 72 hours. Lipid Profile: No results for input(s): CHOL, HDL, LDLCALC, TRIG, CHOLHDL, LDLDIRECT in the last 72 hours. Thyroid function studies: No results for input(s): TSH, T4TOTAL, T3FREE, THYROIDAB in the last 72 hours.  Invalid input(s): FREET3 Anemia work up: No results for input(s): VITAMINB12, FOLATE, FERRITIN, TIBC, IRON, RETICCTPCT in the last  72 hours. Sepsis Labs: No results for input(s): PROCALCITON, WBC, LATICACIDVEN in the last 168 hours. Microbiology No results found for this or any previous visit (from the past 240 hour(s)).   Medications:   . amLODipine  5 mg Oral Daily  . gabapentin  200 mg Oral TID  . ibuprofen  600 mg Oral TID  . pantoprazole  40 mg Oral BID  . polyethylene glycol  17 g Oral Daily  . sodium chloride flush  3 mL Intravenous Q12H   Continuous Infusions: . sodium chloride    . penicillin g continuous IV infusion 12 Million Units (03/28/18 0649)      LOS: 15 days   Florencia Reasons  [facility]  03/28/2018, 8:09 AM

## 2018-03-29 LAB — CBC
HEMATOCRIT: 33.5 % — AB (ref 39.0–52.0)
Hemoglobin: 10.4 g/dL — ABNORMAL LOW (ref 13.0–17.0)
MCH: 27.7 pg (ref 26.0–34.0)
MCHC: 31 g/dL (ref 30.0–36.0)
MCV: 89.1 fL (ref 80.0–100.0)
Platelets: 308 10*3/uL (ref 150–400)
RBC: 3.76 MIL/uL — ABNORMAL LOW (ref 4.22–5.81)
RDW: 14.6 % (ref 11.5–15.5)
WBC: 8 10*3/uL (ref 4.0–10.5)
nRBC: 0 % (ref 0.0–0.2)

## 2018-03-29 LAB — BASIC METABOLIC PANEL
Anion gap: 6 (ref 5–15)
BUN: 11 mg/dL (ref 6–20)
CHLORIDE: 104 mmol/L (ref 98–111)
CO2: 26 mmol/L (ref 22–32)
Calcium: 8.5 mg/dL — ABNORMAL LOW (ref 8.9–10.3)
Creatinine, Ser: 0.73 mg/dL (ref 0.61–1.24)
GFR calc Af Amer: >60 mL/min (ref >60)
GFR calc non Af Amer: >60 mL/min (ref >60)
GLUCOSE: 116 mg/dL — AB (ref 70–99)
Potassium: 3.9 mmol/L (ref 3.5–5.1)
Sodium: 136 mmol/L (ref 135–145)

## 2018-03-29 LAB — C-REACTIVE PROTEIN: CRP: 2 mg/dL — ABNORMAL HIGH (ref <1.0)

## 2018-03-29 LAB — SEDIMENTATION RATE: Sed Rate: 30 mm/hr — ABNORMAL HIGH (ref 0–16)

## 2018-03-29 NOTE — Progress Notes (Addendum)
PROGRESS NOTE    James [name]  EFE:[id number] DOB: Jul 01, 1980 DOA: 03/12/2018 PCP: Dione Housekeeper, MD  Brief Narrative: 38 year old male with past medical history significant for hypertension on lisinopril, chronic back pain issues who presents complaining of worsening back pain, in the ED he was found to have an epidural abscess with intra-spinal abscess and third rib osteomyelitis.  Patient underwent emergency decompressive laminectomy with abscess evacuation.  Blood cultures order.  Patient was a started on empirically IV vancomycin and Rocephin.  Infectious disease was consulted and they are recommending 4 weeks of IV antibiotics.   Assessment & Plan:   Active Problems:   Epidural abscess   Epidural intraspinal abscess   IVDU (intravenous drug user)   Essential hypertension  1-epidural abscess/epidural intraspinal abscess: Status post emergent T3-4 decompressive laminectomy with evacuation of epidural abscess, resection of epidural phlegmon with microdissection on 3/9 by Dr. Annette Stable.  Culture from epidural abscess grew Streptococcus gordonii Patient need to be on IV penicillin until 04/09/2018 then switch to oral amoxicillin  on 04/10/2018 for 1 month.  Continue with weekly ESR and CRP. Patient was a started on IV penicillin since duration/12/2018. Pain is currently controlled with Neurontin and ibuprofen. Wound care per neurosurgery. ESR has decreased from 6.9-2.0.  IV drug use: As he used heroin intermittently.  Last use was 3 days prior to admission. Hepatitis C HIV negative.  Hypertension: Noncompliant with medication.  Continue with Norvasc.  Restless leg: Chronic for about 2 years.  Does report a history of iron deficiency.  Continue with Neurontin.  Consider chronic iron supplement once infection is really treated.      Estimated body mass index is 36.92 kg/m as calculated from the following:   Height as of this encounter: _0  (1.753 m).   Weight as of this  encounter: 113.4 kg.   DVT prophylaxis: Heparin Code Status: Full code Family Communication: Care discussed with patient Disposition Plan: Need to complete IV antibiotics in-house  Consultants:   Neurosurgery  Infectious disease   Procedures:  emergent T3-4 decompressive laminectomy with evacuation of epidural abscess, resection of epidural phlegmon with microdissection onb 3/9 picc line placement on 3/11  Antimicrobials:  IV vancomycin and Rocephin;; until 3/11: On IV penicillin since 3/12  Subjective: He is doing okay, still complaining of numbness lower extremity.  Objective: Vitals:   03/24/18 0519 03/25/18 0529 03/26/18 0528 03/27/18 0517  BP: 121/83 126/77 115/78 120/89  Pulse: 90 65 (!) 56 91  Resp: _1 Temp: 98.1 F (36.7 C) 97.6 F (36.4 C) 98.2 F (36.8 C) 98.2 F (36.8 C)  TempSrc: Oral Oral Oral Oral  SpO2: 100% 100% 99% 98%  Weight:      Height:        Intake/Output Summary (Last 24 hours) at 03/29/2018 1159 Last data filed at 03/29/2018 1100 Gross per 24 hour  Intake 1440 ml  Output -  Net 1440 ml   Filed Weights   03/12/18 2151  Weight: 113.4 kg    Examination:  General exam: Appears calm and comfortable  Respiratory system: Clear to auscultation. Respiratory effort normal. Cardiovascular system: S1 & S2 heard, RRR. No JVD, murmurs, rubs, gallops or clicks. No pedal edema. Gastrointestinal system: Abdomen is nondistended, soft and nontender. No organomegaly or masses felt. Normal bowel sounds heard. Central nervous system: Alert and oriented. No focal neurological deficits. Extremities: Symmetric 5 x 5 power. Skin: No rashes, lesions or ulcers    Data Reviewed: I have personally reviewed  following labs and imaging studies  CBC: Recent Labs  Lab 03/29/18 0427  WBC 8.0  HGB 10.4*  HCT 33.5*  MCV 89.1  PLT 290   Basic Metabolic Panel: Recent Labs  Lab 03/29/18 0427  NA 136  K 3.9  CL 104  CO2 26  GLUCOSE 116*   BUN 11  CREATININE 0.73  CALCIUM 8.5*   GFR: Estimated Creatinine Clearance: 158.5 mL/min (by C-G formula based on SCr of 0.73 mg/dL). Liver Function Tests: No results for input(s): AST, ALT, ALKPHOS, BILITOT, PROT, ALBUMIN in the last 168 hours. No results for input(s): LIPASE, AMYLASE in the last 168 hours. No results for input(s): AMMONIA in the last 168 hours. Coagulation Profile: No results for input(s): INR, PROTIME in the last 168 hours. Cardiac Enzymes: No results for input(s): CKTOTAL, CKMB, CKMBINDEX, TROPONINI in the last 168 hours. BNP (last 3 results) No results for input(s): PROBNP in the last 8760 hours. HbA1C: No results for input(s): HGBA1C in the last 72 hours. CBG: No results for input(s): GLUCAP in the last 168 hours. Lipid Profile: No results for input(s): CHOL, HDL, LDLCALC, TRIG, CHOLHDL, LDLDIRECT in the last 72 hours. Thyroid Function Tests: No results for input(s): TSH, T4TOTAL, FREET4, T3FREE, THYROIDAB in the last 72 hours. Anemia Panel: No results for input(s): VITAMINB12, FOLATE, FERRITIN, TIBC, IRON, RETICCTPCT in the last 72 hours. Sepsis Labs: No results for input(s): PROCALCITON, LATICACIDVEN in the last 168 hours.  No results found for this or any previous visit (from the past 240 hour(s)).       Radiology Studies: No results found.      Scheduled Meds: . amLODipine  5 mg Oral Daily  . gabapentin  200 mg Oral TID  . ibuprofen  600 mg Oral TID  . pantoprazole  40 mg Oral BID  . polyethylene glycol  17 g Oral Daily  . sodium chloride flush  3 mL Intravenous Q12H   Continuous Infusions: . sodium chloride    . penicillin g continuous IV infusion 12 Million Units (03/29/18 0943)     LOS: 16 days    Time spent: 35 minutes.     Elmarie Shiley, MD [facility] Pager [phone]  If 7PM-7AM, please contact night-coverage [email or web address] Password [email or web address] 03/29/2018, 11:59 AM

## 2018-03-30 NOTE — Progress Notes (Signed)
PROGRESS NOTE    James [name]  RZN:[id number] DOB: 1980-09-08 DOA: 03/12/2018 PCP: Dione Housekeeper, MD  Brief Narrative: 38 year old male with past medical history significant for hypertension on lisinopril, chronic back pain issues who presents complaining of worsening back pain, in the ED he was found to have an epidural abscess with intra-spinal abscess and third rib osteomyelitis.  Patient underwent emergency decompressive laminectomy with abscess evacuation.  Blood cultures order.  Patient was a started on empirically IV vancomycin and Rocephin.  Infectious disease was consulted and they are recommending 4 weeks of IV antibiotics.   Assessment & Plan:   Active Problems:   Epidural abscess   Epidural intraspinal abscess   IVDU (intravenous drug user)   Essential hypertension  1-epidural abscess/epidural intraspinal abscess: Status post emergent T3-4 decompressive laminectomy with evacuation of epidural abscess, resection of epidural phlegmon with microdissection on 3/9 by Dr. Annette Stable.  Culture from epidural abscess grew Streptococcus gordonii Patient need to be on IV penicillin until 04/09/2018 then switch to oral amoxicillin  on 04/10/2018 for 1 month.  Continue with weekly ESR and CRP. Patient was a started on IV penicillin since duration/12/2018. Pain is currently controlled with Neurontin and ibuprofen. Wound care per neurosurgery. Incision healing.  ESR has decreased from 6.9-2.0.  IV drug use: As he used heroin intermittently.  Last use was 3 days prior to admission. Hepatitis C HIV negative.  Hypertension: Noncompliant with medication.  Continue with Norvasc.  Restless leg: Chronic for about 2 years.  Does report a history of iron deficiency.  Continue with Neurontin.  Consider chronic iron supplement once infection is really treated.      Estimated body mass index is 36.92 kg/m as calculated from the following:   Height as of this encounter: _0  (1.753 m).   Weight  as of this encounter: 113.4 kg.   DVT prophylaxis: Heparin Code Status: Full code Family Communication: Care discussed with patient Disposition Plan: Need to complete IV antibiotics in-house  Consultants:   Neurosurgery  Infectious disease   Procedures:  emergent T3-4 decompressive laminectomy with evacuation of epidural abscess, resection of epidural phlegmon with microdissection onb 3/9 picc line placement on 3/11  Antimicrobials:  IV vancomycin and Rocephin;; until 3/11: On IV penicillin since 3/12  Subjective: Doing well, no complaints.   Objective: Vitals:   03/25/18 0529 03/26/18 0528 03/27/18 0517 03/30/18 0602  BP: 126/77 115/78 120/89 109/69  Pulse: 65 (!) 56 91 72  Resp: _1 Temp: 97.6 F (36.4 C) 98.2 F (36.8 C) 98.2 F (36.8 C) (!) 97.5 F (36.4 C)  TempSrc: Oral Oral Oral Oral  SpO2: 100% 99% 98% 99%  Weight:      Height:        Intake/Output Summary (Last 24 hours) at 03/30/2018 0957 Last data filed at 03/29/2018 1801 Gross per 24 hour  Intake 1000 ml  Output -  Net 1000 ml   Filed Weights   03/12/18 2151  Weight: 113.4 kg    Examination:  General exam: NAD Respiratory system: CTA Cardiovascular system: S 1, S 2 RRR Gastrointestinal system: BS present, soft, nt Central nervous system: non focal.  Extremities: Symmetric power.  Skin: No rashes    Data Reviewed: I have personally reviewed following labs and imaging studies  CBC: Recent Labs  Lab 03/29/18 0427  WBC 8.0  HGB 10.4*  HCT 33.5*  MCV 89.1  PLT 301   Basic Metabolic Panel: Recent Labs  Lab 03/29/18 0427  NA 136  K 3.9  CL 104  CO2 26  GLUCOSE 116*  BUN 11  CREATININE 0.73  CALCIUM 8.5*   GFR: Estimated Creatinine Clearance: 158.5 mL/min (by C-G formula based on SCr of 0.73 mg/dL). Liver Function Tests: No results for input(s): AST, ALT, ALKPHOS, BILITOT, PROT, ALBUMIN in the last 168 hours. No results for input(s): LIPASE, AMYLASE in the last  168 hours. No results for input(s): AMMONIA in the last 168 hours. Coagulation Profile: No results for input(s): INR, PROTIME in the last 168 hours. Cardiac Enzymes: No results for input(s): CKTOTAL, CKMB, CKMBINDEX, TROPONINI in the last 168 hours. BNP (last 3 results) No results for input(s): PROBNP in the last 8760 hours. HbA1C: No results for input(s): HGBA1C in the last 72 hours. CBG: No results for input(s): GLUCAP in the last 168 hours. Lipid Profile: No results for input(s): CHOL, HDL, LDLCALC, TRIG, CHOLHDL, LDLDIRECT in the last 72 hours. Thyroid Function Tests: No results for input(s): TSH, T4TOTAL, FREET4, T3FREE, THYROIDAB in the last 72 hours. Anemia Panel: No results for input(s): VITAMINB12, FOLATE, FERRITIN, TIBC, IRON, RETICCTPCT in the last 72 hours. Sepsis Labs: No results for input(s): PROCALCITON, LATICACIDVEN in the last 168 hours.  No results found for this or any previous visit (from the past 240 hour(s)).       Radiology Studies: No results found.      Scheduled Meds: . amLODipine  5 mg Oral Daily  . gabapentin  200 mg Oral TID  . ibuprofen  600 mg Oral TID  . pantoprazole  40 mg Oral BID  . polyethylene glycol  17 g Oral Daily  . sodium chloride flush  3 mL Intravenous Q12H   Continuous Infusions: . sodium chloride    . penicillin g continuous IV infusion 12 Million Units (03/30/18 0955)     LOS: 17 days    Time spent: 35 minutes.     Elmarie Shiley, MD [facility] Pager [phone]  If 7PM-7AM, please contact night-coverage [email or web address] Password [facility] 03/30/2018, 9:57 AM

## 2018-03-31 LAB — CBC
HCT: 33.7 % — ABNORMAL LOW (ref 39.0–52.0)
Hemoglobin: 10.4 g/dL — ABNORMAL LOW (ref 13.0–17.0)
MCH: 27.3 pg (ref 26.0–34.0)
MCHC: 30.9 g/dL (ref 30.0–36.0)
MCV: 88.5 fL (ref 80.0–100.0)
Platelets: 264 10*3/uL (ref 150–400)
RBC: 3.81 MIL/uL — ABNORMAL LOW (ref 4.22–5.81)
RDW: 14.7 % (ref 11.5–15.5)
WBC: 5.4 10*3/uL (ref 4.0–10.5)
nRBC: 0 % (ref 0.0–0.2)

## 2018-03-31 NOTE — Progress Notes (Signed)
Nutrition Brief Note  RD working remotely.  RD pulled to chart secondary to LOS (18 days).   Wt Readings from Last 15 Encounters:  03/12/18 113.4 kg  03/12/18 67.27 kg   37 year old male with history of IV drug abuse presents with back pain and increasing numbness and some weakness in the both lower extremities.  No fever.  No chills.  No other areas of pain or problem.  Symptoms been gradually progressing.  Difficulty ambulating.  No incontinence.  Chart reviewed. Anticipate extended hospitalization due to need to complete IV antibiotics in the hospital. Per MD notes, pt will be on IV penicillin until 04/09/18 and transition to PO on 04/10/18.   Body mass index is 36.92 kg/m. Patient meets criteria for obesity, class II based on current BMI.   Current diet order is regular, patient is consuming approximately 100% of meals at this time. Labs and medications reviewed.   No nutrition interventions warranted at this time. If nutrition issues arise, please consult RD.   Ellyse Rotolo A. Mayford Knife, RD, LDN, CDCES Registered Dietitian II Certified Diabetes Care and Education Specialist Pager: [phone] After hours Pager: [phone]

## 2018-03-31 NOTE — Progress Notes (Signed)
PROGRESS NOTE    James [name]  KTG:[id number] DOB: 1980/07/13 DOA: 03/12/2018 PCP: Dione Housekeeper, MD  Brief Narrative: 38 year old male with past medical history significant for hypertension on lisinopril, chronic back pain issues who presents complaining of worsening back pain, in the ED he was found to have an epidural abscess with intra-spinal abscess and third rib osteomyelitis.  Patient underwent emergency decompressive laminectomy with abscess evacuation.  Blood cultures order.  Patient was a started on empirically IV vancomycin and Rocephin.  Infectious disease was consulted and they are recommending 4 weeks of IV antibiotics.   Assessment & Plan:   Active Problems:   Epidural abscess   Epidural intraspinal abscess   IVDU (intravenous drug user)   Essential hypertension  1-Epidural abscess/epidural intraspinal abscess: Status post emergent T3-4 decompressive laminectomy with evacuation of epidural abscess, resection of epidural phlegmon with microdissection on 3/9 by Dr. Annette Stable.  Culture from epidural abscess grew Streptococcus gordonii Patient need to be on IV penicillin until 04/09/2018 then switch to oral amoxicillin  on 04/10/2018 for 1 month.  Continue with weekly ESR and CRP. Patient was a started on IV penicillin since duration/12/2018. Pain is currently controlled with Neurontin and ibuprofen. Wound care per neurosurgery. Incision healing.  ESR has decreased from 6.9-2.0. Picc line site evaluated 3-27 no redness seeing. Needs close follow up by IV team.   IV drug use: As he used heroin intermittently.  Last use was 3 days prior to admission. Hepatitis C HIV negative.  Hypertension: Noncompliant with medication.  Continue with Norvasc.  Restless leg: Chronic for about 2 years.  Does report a history of iron deficiency.  Continue with Neurontin.  Consider chronic iron supplement once infection is really treated.      Estimated body mass index is 36.92 kg/m as  calculated from the following:   Height as of this encounter: '5\' 9"'$  (1.753 m).   Weight as of this encounter: 113.4 kg.   DVT prophylaxis: Heparin Code Status: Full code Family Communication: Care discussed with patient Disposition Plan: Need to complete IV antibiotics in-house  Consultants:   Neurosurgery  Infectious disease   Procedures:  emergent T3-4 decompressive laminectomy with evacuation of epidural abscess, resection of epidural phlegmon with microdissection onb 3/9 picc line placement on 3/11  Antimicrobials:  IV vancomycin and Rocephin;; until 3/11: On IV penicillin since 3/12  Subjective: He denies chest pain, abdominal pain.   Objective: Vitals:   03/27/18 0517 03/30/18 0602 03/30/18 1022 03/31/18 0444  BP: 120/89 109/69 124/79 126/87  Pulse: 91 72 84 62  Resp: '18 16  18  '$ Temp: 98.2 F (36.8 C) (!) 97.5 F (36.4 C) 98.5 F (36.9 C) 98.1 F (36.7 C)  TempSrc: Oral Oral Oral Oral  SpO2: 98% 99% 100% 99%  Weight:      Height:        Intake/Output Summary (Last 24 hours) at 03/31/2018 0810 Last data filed at 03/31/2018 0100 Gross per 24 hour  Intake 4761.3 ml  Output -  Net 4761.3 ml   Filed Weights   03/12/18 2151  Weight: 113.4 kg    Examination:  General exam: NAD Respiratory system: CTA Cardiovascular system: S 1, S 2 RRR Gastrointestinal system: BS present, soft, nt Central nervous system: Non focal. Numbness LE Extremities: symmetric power.   Skin: No rashes.     Data Reviewed: I have personally reviewed following labs and imaging studies  CBC: Recent Labs  Lab 03/29/18 0427 03/31/18 0510  WBC 8.0 5.4  HGB 10.4* 10.4*  HCT 33.5* 33.7*  MCV 89.1 88.5  PLT 308 670   Basic Metabolic Panel: Recent Labs  Lab 03/29/18 0427  NA 136  K 3.9  CL 104  CO2 26  GLUCOSE 116*  BUN 11  CREATININE 0.73  CALCIUM 8.5*   GFR: Estimated Creatinine Clearance: 158.5 mL/min (by C-G formula based on SCr of 0.73 mg/dL). Liver  Function Tests: No results for input(s): AST, ALT, ALKPHOS, BILITOT, PROT, ALBUMIN in the last 168 hours. No results for input(s): LIPASE, AMYLASE in the last 168 hours. No results for input(s): AMMONIA in the last 168 hours. Coagulation Profile: No results for input(s): INR, PROTIME in the last 168 hours. Cardiac Enzymes: No results for input(s): CKTOTAL, CKMB, CKMBINDEX, TROPONINI in the last 168 hours. BNP (last 3 results) No results for input(s): PROBNP in the last 8760 hours. HbA1C: No results for input(s): HGBA1C in the last 72 hours. CBG: No results for input(s): GLUCAP in the last 168 hours. Lipid Profile: No results for input(s): CHOL, HDL, LDLCALC, TRIG, CHOLHDL, LDLDIRECT in the last 72 hours. Thyroid Function Tests: No results for input(s): TSH, T4TOTAL, FREET4, T3FREE, THYROIDAB in the last 72 hours. Anemia Panel: No results for input(s): VITAMINB12, FOLATE, FERRITIN, TIBC, IRON, RETICCTPCT in the last 72 hours. Sepsis Labs: No results for input(s): PROCALCITON, LATICACIDVEN in the last 168 hours.  No results found for this or any previous visit (from the past 240 hour(s)).       Radiology Studies: No results found.      Scheduled Meds: . amLODipine  5 mg Oral Daily  . gabapentin  200 mg Oral TID  . ibuprofen  600 mg Oral TID  . pantoprazole  40 mg Oral BID  . polyethylene glycol  17 g Oral Daily  . sodium chloride flush  3 mL Intravenous Q12H   Continuous Infusions: . sodium chloride    . penicillin g continuous IV infusion 12 Million Units (03/30/18 2132)     LOS: 18 days    Time spent: 35 minutes.     Elmarie Shiley, MD [facility] Pager [phone]  If 7PM-7AM, please contact night-coverage [email or web address] Password [email or web address] 03/31/2018, 8:10 AM

## 2018-04-01 NOTE — Progress Notes (Signed)
PROGRESS NOTE    James [name]  MAU:[id number] DOB: 1980/07/06 DOA: 03/12/2018 PCP: Dione Housekeeper, MD  Brief Narrative: 38 year old male with past medical history significant for hypertension on lisinopril, chronic back pain issues who presents complaining of worsening back pain, in the ED he was found to have an epidural abscess with intra-spinal abscess and third rib osteomyelitis.  Patient underwent emergency decompressive laminectomy with abscess evacuation.  Blood cultures order.  Patient was a started on empirically IV vancomycin and Rocephin.  Infectious disease was consulted and they are recommending 4 weeks of IV antibiotics.   Assessment & Plan:   Active Problems:   Epidural abscess   Epidural intraspinal abscess   IVDU (intravenous drug user)   Essential hypertension  1-Epidural abscess/epidural intraspinal abscess: Status post emergent T3-4 decompressive laminectomy with evacuation of epidural abscess, resection of epidural phlegmon with microdissection on 3/9 by Dr. Annette Stable.  Culture from epidural abscess grew Streptococcus gordonii Patient need to be on IV penicillin until 04/09/2018 then switch to oral amoxicillin  on 04/10/2018 for 1 month.  Continue with weekly ESR and CRP. Patient was a started on IV penicillin since duration/12/2018. Pain is currently controlled with Neurontin and ibuprofen. Wound care per neurosurgery. Incision healing.  ESR has decreased from 6.9-2.0. Picc line site evaluated 3-27 no redness seeing. Needs close follow up by IV team.   IV drug use: As he used heroin intermittently.  Last use was 3 days prior to admission. Hepatitis C HIV negative.  Hypertension: Noncompliant with medication.  Continue with Norvasc.  Restless leg: Chronic for about 2 years.  Does report a history of iron deficiency.  Continue with Neurontin.  Consider chronic iron supplement once infection is really treated.  Obesity ; diet recommendation.     Estimated body mass  index is 36.92 kg/m as calculated from the following:   Height as of this encounter: '5\' 9"'$  (1.753 m).   Weight as of this encounter: 113.4 kg.   DVT prophylaxis: Heparin Code Status: Full code Family Communication: Care discussed with patient Disposition Plan: Need to complete IV antibiotics in-house  Consultants:   Neurosurgery  Infectious disease   Procedures:  emergent T3-4 decompressive laminectomy with evacuation of epidural abscess, resection of epidural phlegmon with microdissection onb 3/9 picc line placement on 3/11  Antimicrobials:  IV vancomycin and Rocephin;; until 3/11: On IV penicillin since 3/12  Subjective: No new complaints. Denies pain.    Objective: Vitals:   03/30/18 0602 03/30/18 1022 03/31/18 0444 04/01/18 0542  BP: 109/69 124/79 126/87 123/82  Pulse: 72 84 62 (!) 55  Resp: '16  18 18  '$ Temp: (!) 97.5 F (36.4 C) 98.5 F (36.9 C) 98.1 F (36.7 C) 97.7 F (36.5 C)  TempSrc: Oral Oral Oral Oral  SpO2: 99% 100% 99% 100%  Weight:      Height:        Intake/Output Summary (Last 24 hours) at 04/01/2018 0933 Last data filed at 04/01/2018 0600 Gross per 24 hour  Intake 1901.1 ml  Output -  Net 1901.1 ml   Filed Weights   03/12/18 2151  Weight: 113.4 kg    Examination:  General exam: NAD Respiratory system: CTA Cardiovascular system; S 1, S 2 RRR Gastrointestinal system: BS present, soft, nt Central nervous system: non focal. , numbness LE Extremities: Symmetric power.  Skin: No rashes.     Data Reviewed: I have personally reviewed following labs and imaging studies  CBC: Recent Labs  Lab 03/29/18 0427 03/31/18 0510  WBC 8.0 5.4  HGB 10.4* 10.4*  HCT 33.5* 33.7*  MCV 89.1 88.5  PLT 308 341   Basic Metabolic Panel: Recent Labs  Lab 03/29/18 0427  NA 136  K 3.9  CL 104  CO2 26  GLUCOSE 116*  BUN 11  CREATININE 0.73  CALCIUM 8.5*   GFR: Estimated Creatinine Clearance: 158.5 mL/min (by C-G formula based on SCr of  0.73 mg/dL). Liver Function Tests: No results for input(s): AST, ALT, ALKPHOS, BILITOT, PROT, ALBUMIN in the last 168 hours. No results for input(s): LIPASE, AMYLASE in the last 168 hours. No results for input(s): AMMONIA in the last 168 hours. Coagulation Profile: No results for input(s): INR, PROTIME in the last 168 hours. Cardiac Enzymes: No results for input(s): CKTOTAL, CKMB, CKMBINDEX, TROPONINI in the last 168 hours. BNP (last 3 results) No results for input(s): PROBNP in the last 8760 hours. HbA1C: No results for input(s): HGBA1C in the last 72 hours. CBG: No results for input(s): GLUCAP in the last 168 hours. Lipid Profile: No results for input(s): CHOL, HDL, LDLCALC, TRIG, CHOLHDL, LDLDIRECT in the last 72 hours. Thyroid Function Tests: No results for input(s): TSH, T4TOTAL, FREET4, T3FREE, THYROIDAB in the last 72 hours. Anemia Panel: No results for input(s): VITAMINB12, FOLATE, FERRITIN, TIBC, IRON, RETICCTPCT in the last 72 hours. Sepsis Labs: No results for input(s): PROCALCITON, LATICACIDVEN in the last 168 hours.  No results found for this or any previous visit (from the past 240 hour(s)).       Radiology Studies: No results found.      Scheduled Meds: . amLODipine  5 mg Oral Daily  . gabapentin  200 mg Oral TID  . ibuprofen  600 mg Oral TID  . pantoprazole  40 mg Oral BID  . polyethylene glycol  17 g Oral Daily  . sodium chloride flush  3 mL Intravenous Q12H   Continuous Infusions: . sodium chloride    . penicillin g continuous IV infusion 12 Million Units (04/01/18 0927)     LOS: 19 days    Time spent: 35 minutes.     Elmarie Shiley, MD [facility] Pager [phone]  If 7PM-7AM, please contact night-coverage [email or web address] Password [facility] 04/01/2018, 9:33 AM

## 2018-04-02 DIAGNOSIS — R200 Anesthesia of skin: Secondary | ICD-10-CM

## 2018-04-02 NOTE — Progress Notes (Signed)
PROGRESS NOTE    James [name]  TLX:[id number] DOB: [birth year] DOA: 03/12/2018 PCP: Dione Housekeeper, MD  Brief Narrative: 38 year old male with past medical history significant for hypertension on lisinopril, chronic back pain issues who presents complaining of worsening back pain, in the ED he was found to have an epidural abscess with intra-spinal abscess and third rib osteomyelitis.  Patient underwent emergency decompressive laminectomy with abscess evacuation.  Blood cultures order.  Patient was a started on empirically IV vancomycin and Rocephin.  Infectious disease was consulted and they are recommending 4 weeks of IV antibiotics.   Assessment & Plan:   Active Problems:   Epidural abscess   Epidural intraspinal abscess   IVDU (intravenous drug user)   Essential hypertension  1-Epidural abscess/epidural intraspinal abscess: Status post emergent T3-4 decompressive laminectomy with evacuation of epidural abscess, resection of epidural phlegmon with microdissection on 3/9 by Dr. Annette Stable.  Culture from epidural abscess grew Streptococcus gordonii Patient need to be on IV penicillin until 04/09/2018 then switch to oral amoxicillin  on 04/10/2018 for 1 month.  Continue with weekly ESR and CRP. Patient was a started on IV penicillin since duration/12/2018. Pain is currently controlled with Neurontin and ibuprofen. Wound care per neurosurgery. Incision healing.  ESR has decreased from 6.9-2.0. Picc line site evaluated 3-27 no redness seeing. Needs close follow up by IV team.  Awaiting to finished antibiotics.  Check b12 level.   IV drug use: As he used heroin intermittently.  Last use was 3 days prior to admission. Hepatitis C HIV negative.  Hypertension: Noncompliant with medication.  Continue with Norvasc.  Restless leg: Chronic for about 2 years.  Does report a history of iron deficiency.  Continue with Neurontin.  Consider chronic iron supplement once infection is really treated.   Obesity ; diet recommendation.   Anemia;  Check anemia panel   Estimated body mass index is 36.92 kg/m as calculated from the following:   Height as of this encounter: '5\' 9"'$  (1.753 m).   Weight as of this encounter: 113.4 kg.   DVT prophylaxis: Heparin Code Status: Full code Family Communication: Care discussed with patient Disposition Plan: Need to complete IV antibiotics in-house  Consultants:   Neurosurgery  Infectious disease   Procedures:  emergent T3-4 decompressive laminectomy with evacuation of epidural abscess, resection of epidural phlegmon with microdissection onb 3/9 picc line placement on 3/11  Antimicrobials:  IV vancomycin and Rocephin;; until 3/11: On IV penicillin since 3/12  Subjective: No new complaints.     Objective: Vitals:   03/30/18 1022 03/31/18 0444 04/01/18 0542 04/02/18 0600  BP: 124/79 126/87 123/82 (!) 114/94  Pulse: 84 62 (!) 55 65  Resp:  '18 18 18  '$ Temp: 98.5 F (36.9 C) 98.1 F (36.7 C) 97.7 F (36.5 C) (!) 97.5 F (36.4 C)  TempSrc: Oral Oral Oral Oral  SpO2: 100% 99% 100% 100%  Weight:      Height:        Intake/Output Summary (Last 24 hours) at 04/02/2018 0841 Last data filed at 04/01/2018 1500 Gross per 24 hour  Intake 1080.3 ml  Output -  Net 1080.3 ml   Filed Weights   03/12/18 2151  Weight: 113.4 kg    Examination:  General exam: NAD Respiratory system: CTA Cardiovascular system; S 1, S 2 RRR Gastrointestinal system: BS present, soft, nt Central nervous system: LE numbness Extremities: symmetric power.  Skin: No rashes.     Data Reviewed: I have personally reviewed following labs and imaging  studies  CBC: Recent Labs  Lab 03/29/18 0427 03/31/18 0510  WBC 8.0 5.4  HGB 10.4* 10.4*  HCT 33.5* 33.7*  MCV 89.1 88.5  PLT 308 483   Basic Metabolic Panel: Recent Labs  Lab 03/29/18 0427  NA 136  K 3.9  CL 104  CO2 26  GLUCOSE 116*  BUN 11  CREATININE 0.73  CALCIUM 8.5*   GFR:  Estimated Creatinine Clearance: 158.5 mL/min (by C-G formula based on SCr of 0.73 mg/dL). Liver Function Tests: No results for input(s): AST, ALT, ALKPHOS, BILITOT, PROT, ALBUMIN in the last 168 hours. No results for input(s): LIPASE, AMYLASE in the last 168 hours. No results for input(s): AMMONIA in the last 168 hours. Coagulation Profile: No results for input(s): INR, PROTIME in the last 168 hours. Cardiac Enzymes: No results for input(s): CKTOTAL, CKMB, CKMBINDEX, TROPONINI in the last 168 hours. BNP (last 3 results) No results for input(s): PROBNP in the last 8760 hours. HbA1C: No results for input(s): HGBA1C in the last 72 hours. CBG: No results for input(s): GLUCAP in the last 168 hours. Lipid Profile: No results for input(s): CHOL, HDL, LDLCALC, TRIG, CHOLHDL, LDLDIRECT in the last 72 hours. Thyroid Function Tests: No results for input(s): TSH, T4TOTAL, FREET4, T3FREE, THYROIDAB in the last 72 hours. Anemia Panel: No results for input(s): VITAMINB12, FOLATE, FERRITIN, TIBC, IRON, RETICCTPCT in the last 72 hours. Sepsis Labs: No results for input(s): PROCALCITON, LATICACIDVEN in the last 168 hours.  No results found for this or any previous visit (from the past 240 hour(s)).       Radiology Studies: No results found.      Scheduled Meds: . amLODipine  5 mg Oral Daily  . gabapentin  200 mg Oral TID  . ibuprofen  600 mg Oral TID  . pantoprazole  40 mg Oral BID  . polyethylene glycol  17 g Oral Daily  . sodium chloride flush  3 mL Intravenous Q12H   Continuous Infusions: . sodium chloride    . penicillin g continuous IV infusion 12 Million Units (04/01/18 2121)     LOS: 20 days    Time spent: 35 minutes.     Elmarie Shiley, MD [facility] Pager [phone]  If 7PM-7AM, please contact night-coverage [email or web address] Password [email or web address] 04/02/2018, 8:40 AM

## 2018-04-03 LAB — IRON AND TIBC
Iron: 71 ug/dL (ref 45–182)
Saturation Ratios: 20 % (ref 17.9–39.5)
TIBC: 353 ug/dL (ref 250–450)
UIBC: 282 ug/dL

## 2018-04-03 LAB — FERRITIN: Ferritin: 67 ng/mL (ref 24–336)

## 2018-04-03 LAB — RETICULOCYTES
IMMATURE RETIC FRACT: 14.2 % (ref 2.3–15.9)
RBC.: 4.09 MIL/uL — AB (ref 4.22–5.81)
Retic Count, Absolute: 126.4 10*3/uL (ref 19.0–186.0)
Retic Ct Pct: 3.1 % (ref 0.4–3.1)

## 2018-04-03 LAB — VITAMIN B12: Vitamin B-12: 243 pg/mL (ref 180–914)

## 2018-04-03 LAB — FOLATE: Folate: 6 ng/mL (ref >5.9)

## 2018-04-03 MED ORDER — VITAMIN B-12 100 MCG PO TABS
100.0000 ug | ORAL_TABLET | Freq: Every day | ORAL | Status: DC
  Administered 2018-04-03 – 2018-04-09 (×7): 100 ug via ORAL
  Filled 2018-04-03 (×7): qty 1

## 2018-04-03 NOTE — TOC Initial Note (Addendum)
Transition of Care [facility]) - Initial/Assessment Note    Patient Details  Name: James [name] MRN: [id number] Date of Birth: 29-May-1980  Transition [facility]) CM/SW Contact:    James [name], [place] Phone Number: 04/03/2018, 10:04 AM  Clinical Narrative:                 CSW met with pt at bedside. Introduced self, role, and reason for visit. Pt from home with his partner. They live in his fathers basement apartment. Pt from [place], has been working for a [employer] company. Due to being out of work pt insurance lapsed. He states he has good support and is able to obtain rides to appointments when needed. He is interested in obtaining medical records so that he can see a follow up neurosurgical provider for his back pain. When asked about substance use pt states he utilizes heroin for pain management. He has not had follow up and care for this pain so he usually uses pills or injects heroin instead. Pt interested in "free" or sliding scale substance use cessation providers. He is familiar with resources in [place] area and says friends have had to utilize services in the [facility] area due to lack of providers. He consented to a packet being placed in his chart that he will take with him at discharge.   Will f/u on how pt can obtain records and [name] on status of pt Medicaid application.   Expected Discharge Plan: Home/Self Care Barriers to Discharge: Active Substance Use with PICC Line, Continued Medical Work up   Patient Goals and CMS Choice Patient states their goals for this hospitalization and ongoing recovery are:: to be able to get back to work   Choice offered to / list presented to : Patient  Expected Discharge Plan and Services Expected Discharge Plan: Home/Self Care In-house Referral: Financial Counselor Discharge Planning Services: CM Consult   Living arrangements for the past 2 months: Single Family Home Expected Discharge Date: 03/17/18                         Prior Living Arrangements/Services Living arrangements for the past 2 months: Single Family Home Lives with:: Parents, Spouse Patient language and need for interpreter reviewed:: No Do you feel safe going back to the place where you live?: Yes      Need for Family Participation in Patient Care: No (Comment) Care giver support system in place?: Yes (comment)(spouse and parents)   Criminal Activity/Legal Involvement Pertinent to Current Situation/Hospitalization: No - Comment as needed  Activities of Daily Living Home Assistive Devices/Equipment: Blood pressure cuff ADL Screening (condition at time of admission) Patient's cognitive ability adequate to safely complete daily activities?: Yes Is the patient deaf or have difficulty hearing?: No Does the patient have difficulty seeing, even when wearing glasses/contacts?: No Does the patient have difficulty concentrating, remembering, or making decisions?: No Patient able to express need for assistance with ADLs?: No Does the patient have difficulty dressing or bathing?: No Independently performs ADLs?: Yes (appropriate for developmental age) Does the patient have difficulty walking or climbing stairs?: Yes Weakness of Legs: Both Weakness of Arms/Hands: None  Permission Sought/Granted Permission sought to share information with : Other (comment)(Financial Counselor) Permission granted to share information with : Yes, Verbal Permission Granted              Emotional Assessment Appearance:: Appears stated age Attitude/Demeanor/Rapport: Engaged, Gracious Affect (typically observed): Accepting, Adaptable, Appropriate Orientation: :  Oriented to Self, Oriented to Place, Oriented to  Time, Oriented to Situation Alcohol / Substance Use: Illicit Drugs(Heroin) Psych Involvement: No (comment)  Admission diagnosis:  Bilateral leg numbness [R20.0] Leg weakness, bilateral [R29.898] Epidural intraspinal abscess [G06.1] Patient  Active Problem List   Diagnosis Date Noted  . IVDU (intravenous drug user) 03/14/2018  . Essential hypertension 03/14/2018  . 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  . Epidural intraspinal abscess 03/13/2018   PCP:  James Housekeeper, MD Pharmacy:   [id number] [place] [id number]: [phone]: [phone]    Social Determinants of Health (SDOH) Interventions    Readmission Risk Interventions No flowsheet data found.

## 2018-04-03 NOTE — Progress Notes (Signed)
PROGRESS NOTE    James [name]  ZHG:[id number] DOB: 1980-03-26 DOA: 03/12/2018 PCP: Dione Housekeeper, MD  Brief Narrative: 38 year old male with past medical history significant for hypertension on lisinopril, chronic back pain issues who presents complaining of worsening back pain, in the ED he was found to have an epidural abscess with intra-spinal abscess and third rib osteomyelitis.  Patient underwent emergency decompressive laminectomy with abscess evacuation.  Blood cultures order.  Patient was a started on empirically IV vancomycin and Rocephin.  Infectious disease was consulted and they are recommending 4 weeks of IV antibiotics.   Assessment & Plan:   Active Problems:   Epidural abscess   Epidural intraspinal abscess   IVDU (intravenous drug user)   Essential hypertension  1-Epidural abscess/epidural intraspinal abscess: Status post emergent T3-4 decompressive laminectomy with evacuation of epidural abscess, resection of epidural phlegmon with microdissection on 3/9 by Dr. Annette Stable.  Culture from epidural abscess grew Streptococcus gordonii Patient need to be on IV penicillin until 04/09/2018 then switch to oral amoxicillin  on 04/10/2018 for 1 month.  Continue with weekly ESR and CRP. Patient was a started on IV penicillin since duration/12/2018. Pain is currently controlled with Neurontin and ibuprofen. Wound care per neurosurgery. Incision healing.  ESR has decreased from 6.9-2.0. Picc line site evaluated 3-27 no redness seeing. Needs close follow up by IV team.  Awaiting to finished antibiotics.  B 12 low normal. Will start supplement.   IV drug use: As he used heroin intermittently.  Last use was 3 days prior to admission. Hepatitis C HIV negative.  Hypertension: Noncompliant with medication.  Continue with Norvasc.  Restless leg: Chronic for about 2 years.  Does report a history of iron deficiency.  Continue with Neurontin.  Consider chronic iron supplement once infection is  really treated.  Obesity ; diet recommendation.   Anemia; suspect anemia of chronic diseases related to chronic infection.  Hb has been stable.  Iron, normal. Folic acid normal.  H96 low normal. Started supplement.   Estimated body mass index is 36.92 kg/m as calculated from the following:   Height as of this encounter: _0  (1.753 m).   Weight as of this encounter: 113.4 kg.   DVT prophylaxis: Heparin Code Status: Full code Family Communication: Care discussed with patient Disposition Plan: Need to complete IV antibiotics in-house  Consultants:   Neurosurgery  Infectious disease   Procedures:  emergent T3-4 decompressive laminectomy with evacuation of epidural abscess, resection of epidural phlegmon with microdissection onb 3/9 picc line placement on 3/11  Antimicrobials:  IV vancomycin and Rocephin;; until 3/11: On IV penicillin since 3/12  Subjective: Denies new complaints.    Objective: Vitals:   03/31/18 0444 04/01/18 0542 04/02/18 0600 04/02/18 1443  BP: 126/87 123/82 (!) 114/94 (!) 147/81  Pulse: 62 (!) 55 65 71  Resp: _1 Temp: 98.1 F (36.7 C) 97.7 F (36.5 C) (!) 97.5 F (36.4 C) 98 F (36.7 C)  TempSrc: Oral Oral Oral Oral  SpO2: 99% 100% 100% 99%  Weight:      Height:        Intake/Output Summary (Last 24 hours) at 04/03/2018 0854 Last data filed at 04/02/2018 2215 Gross per 24 hour  Intake 980 ml  Output -  Net 980 ml   Filed Weights   03/12/18 2151  Weight: 113.4 kg    Examination:  General exam: NAD Respiratory system: CTA Cardiovascular system; S 1, S 2 RRR Gastrointestinal system: BS present. Soft, nt  Central nervous system: non focal.  Extremities: symmetric power.  Skin: No rashes.     Data Reviewed: I have personally reviewed following labs and imaging studies  CBC: Recent Labs  Lab 03/29/18 0427 03/31/18 0510  WBC 8.0 5.4  HGB 10.4* 10.4*  HCT 33.5* 33.7*  MCV 89.1 88.5  PLT 308 009   Basic  Metabolic Panel: Recent Labs  Lab 03/29/18 0427  NA 136  K 3.9  CL 104  CO2 26  GLUCOSE 116*  BUN 11  CREATININE 0.73  CALCIUM 8.5*   GFR: Estimated Creatinine Clearance: 158.5 mL/min (by C-G formula based on SCr of 0.73 mg/dL). Liver Function Tests: No results for input(s): AST, ALT, ALKPHOS, BILITOT, PROT, ALBUMIN in the last 168 hours. No results for input(s): LIPASE, AMYLASE in the last 168 hours. No results for input(s): AMMONIA in the last 168 hours. Coagulation Profile: No results for input(s): INR, PROTIME in the last 168 hours. Cardiac Enzymes: No results for input(s): CKTOTAL, CKMB, CKMBINDEX, TROPONINI in the last 168 hours. BNP (last 3 results) No results for input(s): PROBNP in the last 8760 hours. HbA1C: No results for input(s): HGBA1C in the last 72 hours. CBG: No results for input(s): GLUCAP in the last 168 hours. Lipid Profile: No results for input(s): CHOL, HDL, LDLCALC, TRIG, CHOLHDL, LDLDIRECT in the last 72 hours. Thyroid Function Tests: No results for input(s): TSH, T4TOTAL, FREET4, T3FREE, THYROIDAB in the last 72 hours. Anemia Panel: Recent Labs    04/03/18 0312  VITAMINB12 243  FOLATE 6.0  FERRITIN 67  TIBC 353  IRON 71  RETICCTPCT 3.1   Sepsis Labs: No results for input(s): PROCALCITON, LATICACIDVEN in the last 168 hours.  No results found for this or any previous visit (from the past 240 hour(s)).       Radiology Studies: No results found.      Scheduled Meds: . amLODipine  5 mg Oral Daily  . gabapentin  200 mg Oral TID  . ibuprofen  600 mg Oral TID  . pantoprazole  40 mg Oral BID  . polyethylene glycol  17 g Oral Daily  . sodium chloride flush  3 mL Intravenous Q12H   Continuous Infusions: . sodium chloride    . penicillin g continuous IV infusion 12 Million Units (04/03/18 0840)     LOS: 21 days    Time spent: 35 minutes.     Elmarie Shiley, MD [facility] Pager [phone]  If 7PM-7AM, please  contact night-coverage [email or web address] Password [facility] 04/03/2018, 8:54 AM

## 2018-04-04 NOTE — TOC Progression Note (Signed)
Transition of Care [facility]) - Progression Note    Patient Details  Name: Onofre Gains MRN: [id number] Date of Birth: [birth year]  Transition of Care [facility]) CM/SW [place], [place] Phone Number: 04/04/2018, 9:46 AM  Clinical Narrative:    CSW met with pt, confirmed he had spoken with financial counselor-his Medicaid appointment is on the 2nd and he currently does not need to provide any additional information.   Pt also given packet of substance use rehab information, and a form to fill out to release his MRIs and records here. Pt asking if he will be handed records prior to d/c which is not likely.   All questions answered.    Expected Discharge Plan: Home/Self Care Barriers to Discharge: Active Substance Use with PICC Line, Continued Medical Work up  Expected Discharge Plan and Services Expected Discharge Plan: Home/Self Care In-house Referral: Financial Counselor Discharge Planning Services: CM Consult   Living arrangements for the past 2 months: Single Family Home Expected Discharge Date: 03/17/18                         Social Determinants of Health (SDOH) Interventions    Readmission Risk Interventions No flowsheet data found.

## 2018-04-04 NOTE — Progress Notes (Signed)
[facility] contacted due to patient having questions about when he can return to work and weight restrictions when he returns. Dr. Jordan Likes and Little Flock, PA were both unavailable so message left with Hiliary who will pass the message along and have the providers contact the patient directly.

## 2018-04-04 NOTE — Progress Notes (Signed)
PROGRESS NOTE    James [name]  WPV:[id number] DOB: June 20, 1980 DOA: 03/12/2018 PCP: Dione Housekeeper, MD  Brief Narrative: 38 year old male with past medical history significant for hypertension on lisinopril, chronic back pain issues who presents complaining of worsening back pain, in the ED he was found to have an epidural abscess with intra-spinal abscess and third rib osteomyelitis.  Patient underwent emergency decompressive laminectomy with abscess evacuation.  Blood cultures order.  Patient was a started on empirically IV vancomycin and Rocephin.  Infectious disease was consulted and they are recommending 4 weeks of IV antibiotics until 04-09-2018. Patient needs to be discharge on Amoxicillin for 1 month.    Assessment & Plan:   Active Problems:   Epidural abscess   Epidural intraspinal abscess   IVDU (intravenous drug user)   Essential hypertension  1-Epidural abscess/epidural intraspinal abscess: Status post emergent T3-4 decompressive laminectomy with evacuation of epidural abscess, resection of epidural phlegmon with microdissection on 3/9 by Dr. Annette Stable.  Culture from epidural abscess grew Streptococcus gordonii Patient need to be on IV penicillin until 04/09/2018 then switch to oral amoxicillin  on 04/10/2018 for 1 month.  Continue with weekly ESR and CRP. Patient was a started on IV penicillin since duration/12/2018. Pain is currently controlled with Neurontin and ibuprofen. Wound care per neurosurgery. Incision healing.  ESR has decreased from 6.9-2.0. Picc line site evaluated 3-27 no redness seeing. Needs close follow up by IV team.  Awaiting to finished antibiotics.  Stable.   IV drug use: As he used heroin intermittently.  Last use was 3 days prior to admission. Hepatitis C HIV negative.  Hypertension: Noncompliant with medication.  Continue with Norvasc.  Restless leg: Chronic for about 2 years.  Does report a history of iron deficiency.  Continue with Neurontin.  Consider  chronic iron supplement once infection is really treated.  Obesity ; diet recommendation.   Anemia; suspect anemia of chronic diseases related to chronic infection.  Hb has been stable.  Iron, normal. Folic acid normal.  Z48 low normal. Started supplement.   Estimated body mass index is 36.92 kg/m as calculated from the following:   Height as of this encounter: _0  (1.753 m).   Weight as of this encounter: 113.4 kg.   DVT prophylaxis: Heparin Code Status: Full code Family Communication: Care discussed with patient Disposition Plan: Need to complete IV antibiotics in-house  Consultants:   Neurosurgery  Infectious disease   Procedures:  emergent T3-4 decompressive laminectomy with evacuation of epidural abscess, resection of epidural phlegmon with microdissection onb 3/9 picc line placement on 3/11  Antimicrobials:  IV vancomycin and Rocephin;; until 3/11: On IV penicillin since 3/12  Subjective: He is doing well, no new complaints.    Objective: Vitals:   04/02/18 0600 04/02/18 1443 04/03/18 1542 04/04/18 0444  BP: (!) 114/94 (!) 147/81 (!) 131/94 130/77  Pulse: 65 71 81 90  Resp: _1 Temp: (!) 97.5 F (36.4 C) 98 F (36.7 C) 97.9 F (36.6 C) 98.3 F (36.8 C)  TempSrc: Oral Oral Oral Oral  SpO2: 100% 99% 100% 100%  Weight:      Height:        Intake/Output Summary (Last 24 hours) at 04/04/2018 1024 Last data filed at 04/04/2018 0444 Gross per 24 hour  Intake 3743.7 ml  Output -  Net 3743.7 ml   Filed Weights   03/12/18 2151  Weight: 113.4 kg    Examination:  General exam: NAD Respiratory system: CTA Cardiovascular system;  S 1, S 2 RRR Gastrointestinal system:BS present, soft, nt Central nervous system: Non focal.  Extremities: Symmetric power.  Skin: no rashes.     Data Reviewed: I have personally reviewed following labs and imaging studies  CBC: Recent Labs  Lab 03/29/18 0427 03/31/18 0510  WBC 8.0 5.4  HGB 10.4* 10.4*   HCT 33.5* 33.7*  MCV 89.1 88.5  PLT 308 761   Basic Metabolic Panel: Recent Labs  Lab 03/29/18 0427  NA 136  K 3.9  CL 104  CO2 26  GLUCOSE 116*  BUN 11  CREATININE 0.73  CALCIUM 8.5*   GFR: Estimated Creatinine Clearance: 158.5 mL/min (by C-G formula based on SCr of 0.73 mg/dL). Liver Function Tests: No results for input(s): AST, ALT, ALKPHOS, BILITOT, PROT, ALBUMIN in the last 168 hours. No results for input(s): LIPASE, AMYLASE in the last 168 hours. No results for input(s): AMMONIA in the last 168 hours. Coagulation Profile: No results for input(s): INR, PROTIME in the last 168 hours. Cardiac Enzymes: No results for input(s): CKTOTAL, CKMB, CKMBINDEX, TROPONINI in the last 168 hours. BNP (last 3 results) No results for input(s): PROBNP in the last 8760 hours. HbA1C: No results for input(s): HGBA1C in the last 72 hours. CBG: No results for input(s): GLUCAP in the last 168 hours. Lipid Profile: No results for input(s): CHOL, HDL, LDLCALC, TRIG, CHOLHDL, LDLDIRECT in the last 72 hours. Thyroid Function Tests: No results for input(s): TSH, T4TOTAL, FREET4, T3FREE, THYROIDAB in the last 72 hours. Anemia Panel: Recent Labs    04/03/18 0312  VITAMINB12 243  FOLATE 6.0  FERRITIN 67  TIBC 353  IRON 71  RETICCTPCT 3.1   Sepsis Labs: No results for input(s): PROCALCITON, LATICACIDVEN in the last 168 hours.  No results found for this or any previous visit (from the past 240 hour(s)).       Radiology Studies: No results found.      Scheduled Meds: . amLODipine  5 mg Oral Daily  . gabapentin  200 mg Oral TID  . ibuprofen  600 mg Oral TID  . pantoprazole  40 mg Oral BID  . polyethylene glycol  17 g Oral Daily  . sodium chloride flush  3 mL Intravenous Q12H  . vitamin B-12  100 mcg Oral Daily   Continuous Infusions: . sodium chloride    . penicillin g continuous IV infusion 12 Million Units (04/03/18 2204)     LOS: 22 days    Time spent: 35  minutes.     Elmarie Shiley, MD [facility] Pager [phone]  If 7PM-7AM, please contact night-coverage [email or web address] Password [facility] 04/04/2018, 10:24 AM

## 2018-04-05 LAB — BASIC METABOLIC PANEL
Anion gap: 9 (ref 5–15)
BUN: 11 mg/dL (ref 6–20)
CO2: 27 mmol/L (ref 22–32)
CREATININE: 0.65 mg/dL (ref 0.61–1.24)
Calcium: 8.7 mg/dL — ABNORMAL LOW (ref 8.9–10.3)
Chloride: 101 mmol/L (ref 98–111)
GFR calc Af Amer: >60 mL/min (ref >60)
Glucose, Bld: 94 mg/dL (ref 70–99)
Potassium: 4 mmol/L (ref 3.5–5.1)
Sodium: 137 mmol/L (ref 135–145)

## 2018-04-05 LAB — CBC
HCT: 33.6 % — ABNORMAL LOW (ref 39.0–52.0)
Hemoglobin: 10.1 g/dL — ABNORMAL LOW (ref 13.0–17.0)
MCH: 26.9 pg (ref 26.0–34.0)
MCHC: 30.1 g/dL (ref 30.0–36.0)
MCV: 89.6 fL (ref 80.0–100.0)
PLATELETS: 214 10*3/uL (ref 150–400)
RBC: 3.75 MIL/uL — ABNORMAL LOW (ref 4.22–5.81)
RDW: 14.6 % (ref 11.5–15.5)
WBC: 5.7 10*3/uL (ref 4.0–10.5)
nRBC: 0 % (ref 0.0–0.2)

## 2018-04-05 LAB — C-REACTIVE PROTEIN: CRP: 2.1 mg/dL — ABNORMAL HIGH (ref <1.0)

## 2018-04-05 LAB — SEDIMENTATION RATE: Sed Rate: 20 mm/hr — ABNORMAL HIGH (ref 0–16)

## 2018-04-05 NOTE — Progress Notes (Signed)
PROGRESS NOTE    James [name]   BJY:[id number]  DOB: [birth year]  DOA: 03/12/2018 PCP: Joette Catching, MD   Brief Narrative:  James [name] is a 38 year old male with hypertension, IV drug abuse, chronic back pain who presented to the ED on 3/8 for bilateral lower extremity numbness, trouble ambulating and difficulty urinating for a couple of days.   An MRI in the ED showed>  Dorsal epidural phlegmon T1 through T4-5 with 8 x 10 x 34 mm epidural abscess from T2-3 through T4-5. Resultant severe cord compression and cord edema. 2. LEFT T8-9, RIGHT T9-10 and LEFT T10-11 facet septic arthritis. Associated paraspinal myositis. 3. 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   He was evaluated by Dr. Dutch Quint with neurosurgery who took him to the OR on 3/9 for thoracic laminectomy, resection of phlegmon.  3/9-ID was consulted commended vancomycin and ceftriaxone be continued 3/10 [facility] were [facility]   Subjective: He has no complaints.    Assessment & Plan:   Active Problems:   Epidural abscess - Culture from epidural abscess grew Streptococcus gordonii - plan per ID is to cont IV PCN until 4/5 and then switch to Amoxil on 4/6 for 1 month - using Neurontin and Ibuprofen TID for pain    IVDU (intravenous drug user) -Admitted to last using heroin 3 days prior to admission states he was using it intermittently to help control his back pain    Essential hypertension - on Amldodipine  Low normal Vit B12 - level was 243 and is being replaced  Obesity  Body mass index is 36.92 kg/m.   Time spent in minutes:  35 min DVT prophylaxis: SCDs Code Status: Full code Family Communication:  Disposition Plan: home after last dose of antibiotics on 4/5 Consultants:   ID  Initially admitted by Neuro surgery Procedures:  - emergent T3-4 decompressive laminectomy with evacuation of epidural abscess, resection of epidural  phlegmon with microdissection onb 3/9 - picc line placement on 3/11 Antimicrobials:  Anti-infectives (From admission, onward)   Start     Dose/Rate Route Frequency Ordered Stop   03/16/18 1130  penicillin G potassium 12 Million Units in dextrose 5 % 500 mL continuous infusion     12 Million Units 41.7 mL/hr over 12 Hours Intravenous Every 12 hours 03/16/18 1036 04/11/18 0859   03/15/18 1700  cefTRIAXone (ROCEPHIN) 2 g in sodium chloride 0.9 % 100 mL IVPB  Status:  Discontinued     2 g 200 mL/hr over 30 Minutes Intravenous Every 24 hours 03/15/18 1100 03/16/18 1657   03/14/18 0900  cefTRIAXone (ROCEPHIN) 1 g in sodium chloride 0.9 % 100 mL IVPB  Status:  Discontinued     1 g 200 mL/hr over 30 Minutes Intravenous Every 12 hours 03/14/18 0823 03/15/18 1100   03/13/18 1000  cefTRIAXone (ROCEPHIN) 1 g in sodium chloride 0.9 % 100 mL IVPB  Status:  Discontinued     1 g 200 mL/hr over 30 Minutes Intravenous Every 12 hours 03/13/18 0610 03/14/18 0823   03/13/18 1000  vancomycin (VANCOCIN) 1,500 mg in sodium chloride 0.9 % 500 mL IVPB  Status:  Discontinued     1,500 mg 250 mL/hr over 120 Minutes Intravenous Every 12 hours 03/13/18 0708 03/14/18 1311   03/13/18 0421  vancomycin (VANCOCIN) powder  Status:  Discontinued       As needed 03/13/18 0422 03/13/18 0500   03/13/18 0331  bacitracin 50,000 Units in sodium  chloride 0.9 % 500 mL irrigation  Status:  Discontinued       As needed 03/13/18 0332 03/13/18 0500   03/13/18 0330  cefTRIAXone (ROCEPHIN) 1 g in sodium chloride 0.9 % 100 mL IVPB     1 g 200 mL/hr over 30 Minutes Intravenous  Once 03/13/18 0325 03/13/18 0430   03/13/18 0145  cefTRIAXone (ROCEPHIN) 2 g in sodium chloride 0.9 % 100 mL IVPB  Status:  Discontinued     2 g 200 mL/hr over 30 Minutes Intravenous  Once 03/13/18 0136 03/13/18 0150   03/13/18 0145  metroNIDAZOLE (FLAGYL) IVPB 500 mg  Status:  Discontinued     500 mg 100 mL/hr over 60 Minutes Intravenous Every 6 hours 03/13/18  0136 03/13/18 0150       Objective: Vitals:   04/02/18 1443 04/03/18 1542 04/04/18 0444 04/05/18 0616  BP: (!) 147/81 (!) 131/94 130/77 110/70  Pulse: 71 81 90 68  Resp: 18 18 18 18   Temp: 98 F (36.7 C) 97.9 F (36.6 C) 98.3 F (36.8 C) 98 F (36.7 C)  TempSrc: Oral Oral Oral Oral  SpO2: 99% 100% 100% 98%  Weight:      Height:        Intake/Output Summary (Last 24 hours) at 04/05/2018 1229 Last data filed at 04/05/2018 0900 Gross per 24 hour  Intake 1462.8 ml  Output -  Net 1462.8 ml   Filed Weights   03/12/18 2151  Weight: 113.4 kg    Examination: General exam: Appears comfortable  HEENT: PERRLA, oral mucosa moist, no sclera icterus or thrush Respiratory system: Clear to auscultation. Respiratory effort normal. Cardiovascular system: S1 & S2 heard, RRR.   Gastrointestinal system: Abdomen soft, non-tender, nondistended. Normal bowel sounds. Central nervous system: Alert and oriented. No focal neurological deficits. Extremities: No cyanosis, clubbing or edema Skin: No rashes or ulcers Psychiatry:  Mood & affect appropriate.     Data Reviewed: I have personally reviewed following labs and imaging studies  CBC: Recent Labs  Lab 03/31/18 0510 04/05/18 0430  WBC 5.4 5.7  HGB 10.4* 10.1*  HCT 33.7* 33.6*  MCV 88.5 89.6  PLT 264 214   Basic Metabolic Panel: Recent Labs  Lab 04/05/18 0430  NA 137  K 4.0  CL 101  CO2 27  GLUCOSE 94  BUN 11  CREATININE 0.65  CALCIUM 8.7*   GFR: Estimated Creatinine Clearance: 158.5 mL/min (by C-G formula based on SCr of 0.65 mg/dL). Liver Function Tests: No results for input(s): AST, ALT, ALKPHOS, BILITOT, PROT, ALBUMIN in the last 168 hours. No results for input(s): LIPASE, AMYLASE in the last 168 hours. No results for input(s): AMMONIA in the last 168 hours. Coagulation Profile: No results for input(s): INR, PROTIME in the last 168 hours. Cardiac Enzymes: No results for input(s): CKTOTAL, CKMB, CKMBINDEX,  TROPONINI in the last 168 hours. BNP (last 3 results) No results for input(s): PROBNP in the last 8760 hours. HbA1C: No results for input(s): HGBA1C in the last 72 hours. CBG: No results for input(s): GLUCAP in the last 168 hours. Lipid Profile: No results for input(s): CHOL, HDL, LDLCALC, TRIG, CHOLHDL, LDLDIRECT in the last 72 hours. Thyroid Function Tests: No results for input(s): TSH, T4TOTAL, FREET4, T3FREE, THYROIDAB in the last 72 hours. Anemia Panel: Recent Labs    04/03/18 0312  VITAMINB12 243  FOLATE 6.0  FERRITIN 67  TIBC 353  IRON 71  RETICCTPCT 3.1   Urine analysis:    Component Value Date/Time  COLORURINE YELLOW 03/15/2018 0328   APPEARANCEUR CLEAR 03/15/2018 0328   LABSPEC 1.015 03/15/2018 0328   PHURINE 6.0 03/15/2018 0328   GLUCOSEU NEGATIVE 03/15/2018 0328   HGBUR NEGATIVE 03/15/2018 0328   BILIRUBINUR NEGATIVE 03/15/2018 0328   KETONESUR NEGATIVE 03/15/2018 0328   PROTEINUR NEGATIVE 03/15/2018 0328   NITRITE NEGATIVE 03/15/2018 0328   LEUKOCYTESUR NEGATIVE 03/15/2018 0328   Sepsis Labs: (procalcitonin:4,lacticidven:4) )No results found for this or any previous visit (from the past 240 hour(s)).       Radiology Studies: No results found.    Scheduled Meds: . amLODipine  5 mg Oral Daily  . gabapentin  200 mg Oral TID  . ibuprofen  600 mg Oral TID  . pantoprazole  40 mg Oral BID  . polyethylene glycol  17 g Oral Daily  . sodium chloride flush  3 mL Intravenous Q12H  . vitamin B-12  100 mcg Oral Daily   Continuous Infusions: . sodium chloride    . penicillin g continuous IV infusion 12 Million Units (04/05/18 0844)     LOS: 23 days      Calvert Cantor, MD [facility] Pager: [email or web address] Password [facility] 04/05/2018, 12:29 PM

## 2018-04-05 NOTE — Progress Notes (Signed)
Pt.'s informed this vast nurse that he had a shower yesterday and his PICC dressing needs to be changed. Dressing inspected, edges peeling off  . This vast Nurse told  Pt. That dressing needs to be changed at this moment after DBIV  but pt. Refused , stated that he needs to go back to sleep and told this nurse to come back later once he is wide awake for dressing changed. Primary Nurse ,celso,RN mae aware.

## 2018-04-06 NOTE — Progress Notes (Signed)
PROGRESS NOTE    James Todd NorRushil [id number]  DOB: 1980-12-15  DOA: 03/12/2018 PCP: Joette Catching, MD   Brief Narrative:  James [name] is a 38 year old male with hypertension, IV drug abuse, chronic back pain who presented to the ED on 3/8 for bilateral lower extremity numbness, trouble ambulating and difficulty urinating for a couple of days.   An MRI in the ED showed>  Dorsal epidural phlegmon T1 through T4-5 with 8 x 10 x 34 mm epidural abscess from T2-3 through T4-5. Resultant severe cord compression and cord edema. 2. LEFT T8-9, RIGHT T9-10 and LEFT T10-11 facet septic arthritis. Associated paraspinal myositis. 3. 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   He was evaluated by Dr. Dutch Quint with neurosurgery who took him to the OR on 3/9 for thoracic laminectomy, resection of phlegmon.  3/9-ID was consulted commended vancomycin and ceftriaxone be continued 3/10 [facility] were consulted   Subjective: Tells me he noticed some numbness in his back yesterday when he was trying to scratch his back. No other complaints.     Assessment & Plan:   Active Problems:   Epidural abscess - Culture from epidural abscess grew Streptococcus gordonii - plan per ID is to cont IV PCN until 4/5 and then switch to Amoxil on 4/6 for 1 month - using Neurontin and Ibuprofen TID for pain    IVDU (intravenous drug user) -Admitted to last using heroin 3 days prior to admission states he was using it intermittently to help control his back pain    Essential hypertension - on Amldodipine  Low normal Vit B12 - level was 243 and is being replaced  Obesity  Body mass index is 36.92 kg/m.   Time spent in minutes:  35 min DVT prophylaxis: SCDs Code Status: Full code Family Communication:  Disposition Plan: home after last dose of antibiotics on 4/5 Consultants:   ID  Initially admitted by Neuro surgery Procedures:  -  emergent T3-4 decompressive laminectomy with evacuation of epidural abscess, resection of epidural phlegmon with microdissection onb 3/9 - picc line placement on 3/11 Antimicrobials:  Anti-infectives (From admission, onward)   Start     Dose/Rate Route Frequency Ordered Stop   03/16/18 1130  penicillin G potassium 12 Million Units in dextrose 5 % 500 mL continuous infusion     12 Million Units 41.7 mL/hr over 12 Hours Intravenous Every 12 hours 03/16/18 1036 04/11/18 0859   03/15/18 1700  cefTRIAXone (ROCEPHIN) 2 g in sodium chloride 0.9 % 100 mL IVPB  Status:  Discontinued     2 g 200 mL/hr over 30 Minutes Intravenous Every 24 hours 03/15/18 1100 03/16/18 1657   03/14/18 0900  cefTRIAXone (ROCEPHIN) 1 g in sodium chloride 0.9 % 100 mL IVPB  Status:  Discontinued     1 g 200 mL/hr over 30 Minutes Intravenous Every 12 hours 03/14/18 0823 03/15/18 1100   03/13/18 1000  cefTRIAXone (ROCEPHIN) 1 g in sodium chloride 0.9 % 100 mL IVPB  Status:  Discontinued     1 g 200 mL/hr over 30 Minutes Intravenous Every 12 hours 03/13/18 0610 03/14/18 0823   03/13/18 1000  vancomycin (VANCOCIN) 1,500 mg in sodium chloride 0.9 % 500 mL IVPB  Status:  Discontinued     1,500 mg 250 mL/hr over 120 Minutes Intravenous Every 12 hours 03/13/18 0708 03/14/18 1311   03/13/18 0421  vancomycin (VANCOCIN) powder  Status:  Discontinued  As needed 03/13/18 0422 03/13/18 0500   03/13/18 0331  bacitracin 50,000 Units in sodium chloride 0.9 % 500 mL irrigation  Status:  Discontinued       As needed 03/13/18 0332 03/13/18 0500   03/13/18 0330  cefTRIAXone (ROCEPHIN) 1 g in sodium chloride 0.9 % 100 mL IVPB     1 g 200 mL/hr over 30 Minutes Intravenous  Once 03/13/18 0325 03/13/18 0430   03/13/18 0145  cefTRIAXone (ROCEPHIN) 2 g in sodium chloride 0.9 % 100 mL IVPB  Status:  Discontinued     2 g 200 mL/hr over 30 Minutes Intravenous  Once 03/13/18 0136 03/13/18 0150   03/13/18 0145  metroNIDAZOLE (FLAGYL) IVPB 500 mg   Status:  Discontinued     500 mg 100 mL/hr over 60 Minutes Intravenous Every 6 hours 03/13/18 0136 03/13/18 0150       Objective: Vitals:   04/03/18 1542 04/04/18 0444 04/05/18 0616 04/06/18 0540  BP: (!) 131/94 130/77 110/70 118/68  Pulse: 81 90 68 74  Resp: 18 18 18 18   Temp: 97.9 F (36.6 C) 98.3 F (36.8 C) 98 F (36.7 C) 97.9 F (36.6 C)  TempSrc: Oral Oral Oral Oral  SpO2: 100% 100% 98% 100%  Weight:      Height:        Intake/Output Summary (Last 24 hours) at 04/06/2018 1309 Last data filed at 04/06/2018 0836 Gross per 24 hour  Intake 2667.61 ml  Output -  Net 2667.61 ml   Filed Weights   03/12/18 2151  Weight: 113.4 kg    Examination: General exam: Appears comfortable  HEENT: PERRLA, oral mucosa moist, no sclera icterus or thrush Respiratory system: Clear to auscultation. Respiratory effort normal. Cardiovascular system: S1 & S2 heard, RRR.   Gastrointestinal system: Abdomen soft, non-tender, nondistended. Normal bowel sounds. Central nervous system: Alert and oriented. No focal neurological deficits. mild numbness to touch on right mid back under scapula. Extremities: No cyanosis, clubbing or edema Skin: No rashes or ulcers Psychiatry:  Mood & affect appropriate.     Data Reviewed: I have personally reviewed following labs and imaging studies  CBC: Recent Labs  Lab 03/31/18 0510 04/05/18 0430  WBC 5.4 5.7  HGB 10.4* 10.1*  HCT 33.7* 33.6*  MCV 88.5 89.6  PLT 264 214   Basic Metabolic Panel: Recent Labs  Lab 04/05/18 0430  NA 137  K 4.0  CL 101  CO2 27  GLUCOSE 94  BUN 11  CREATININE 0.65  CALCIUM 8.7*   GFR: Estimated Creatinine Clearance: 158.5 mL/min (by C-G formula based on SCr of 0.65 mg/dL). Liver Function Tests: No results for input(s): AST, ALT, ALKPHOS, BILITOT, PROT, ALBUMIN in the last 168 hours. No results for input(s): LIPASE, AMYLASE in the last 168 hours. No results for input(s): AMMONIA in the last 168 hours.  Coagulation Profile: No results for input(s): INR, PROTIME in the last 168 hours. Cardiac Enzymes: No results for input(s): CKTOTAL, CKMB, CKMBINDEX, TROPONINI in the last 168 hours. BNP (last 3 results) No results for input(s): PROBNP in the last 8760 hours. HbA1C: No results for input(s): HGBA1C in the last 72 hours. CBG: No results for input(s): GLUCAP in the last 168 hours. Lipid Profile: No results for input(s): CHOL, HDL, LDLCALC, TRIG, CHOLHDL, LDLDIRECT in the last 72 hours. Thyroid Function Tests: No results for input(s): TSH, T4TOTAL, FREET4, T3FREE, THYROIDAB in the last 72 hours. Anemia Panel: No results for input(s): VITAMINB12, FOLATE, FERRITIN, TIBC, IRON, RETICCTPCT in the  last 72 hours. Urine analysis:    Component Value Date/Time   COLORURINE YELLOW 03/15/2018 0328   APPEARANCEUR CLEAR 03/15/2018 0328   LABSPEC 1.015 03/15/2018 0328   PHURINE 6.0 03/15/2018 0328   GLUCOSEU NEGATIVE 03/15/2018 0328   HGBUR NEGATIVE 03/15/2018 0328   BILIRUBINUR NEGATIVE 03/15/2018 0328   KETONESUR NEGATIVE 03/15/2018 0328   PROTEINUR NEGATIVE 03/15/2018 0328   NITRITE NEGATIVE 03/15/2018 0328   LEUKOCYTESUR NEGATIVE 03/15/2018 0328   Sepsis Labs: @LABRCNTIP (procalcitonin:4,lacticidven:4) )No results found for this or any previous visit (from the past 240 hour(s)).       Radiology Studies: No results found.    Scheduled Meds: . amLODipine  5 mg Oral Daily  . gabapentin  200 mg Oral TID  . ibuprofen  600 mg Oral TID  . pantoprazole  40 mg Oral BID  . polyethylene glycol  17 g Oral Daily  . sodium chloride flush  3 mL Intravenous Q12H  . vitamin B-12  100 mcg Oral Daily   Continuous Infusions: . sodium chloride    . penicillin g continuous IV infusion 12 Million Units (04/06/18 0948)     LOS: 24 days      Calvert Cantor, MD [facility] Pager: [email or web address] Password [email or web address] 04/06/2018, 1:09 PM

## 2018-04-06 NOTE — Plan of Care (Signed)
  Problem: Skin Integrity: Goal: Demonstration of wound healing without infection will improve Outcome: Progressing   Problem: Clinical Measurements: Goal: Will remain free from infection Outcome: Progressing   Problem: Pain Managment: Goal: General experience of comfort will improve Outcome: Progressing   

## 2018-04-07 NOTE — Progress Notes (Signed)
PROGRESS NOTE    James [name]   IWP:[id number]  DOB: October 21, 1980  DOA: 03/12/2018 PCP: James Catching, MD   Brief Narrative:  James [name] is a 38 year old male with hypertension, IV drug abuse, chronic back pain who presented to the ED on 3/8 for bilateral lower extremity numbness, trouble ambulating and difficulty urinating for a couple of days.   An MRI in the ED showed>  Dorsal epidural phlegmon T1 through T4-5 with 8 x 10 x 34 mm epidural abscess from T2-3 through T4-5. Resultant severe cord compression and cord edema. 2. LEFT T8-9, RIGHT T9-10 and LEFT T10-11 facet septic arthritis. Associated paraspinal myositis. 3. 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   He was evaluated by Dr. Dutch [name] with neurosurgery who took him to the OR on 3/9 for thoracic laminectomy, resection of phlegmon.  3/9-ID was consulted commended vancomycin and ceftriaxone be continued 3/10 [facility] were consulted   Subjective: No complaints.    Assessment & Plan:   Active Problems:   Epidural abscess - Culture from epidural abscess grew Streptococcus gordonii - plan per ID is to cont IV PCN until 4/5 and then switch to Amoxil on 4/6 for 1 month - using Neurontin and Ibuprofen TID for pain    IVDU (intravenous drug user) -Admitted to last using heroin 3 days prior to admission states he was using it intermittently to help control his back pain    Essential hypertension - on Amldodipine  Low normal Vit B12 - level was 243 and is being replaced  Obesity  Body mass index is 36.92 kg/m.   Time spent in minutes:  35 min DVT prophylaxis: SCDs Code Status: Full code Family Communication:  Disposition Plan: home after last dose of antibiotics on 4/5 Consultants:   ID  Initially admitted by Neuro surgery Procedures:  - emergent T3-4 decompressive laminectomy with evacuation of epidural abscess, resection of epidural  phlegmon with microdissection onb 3/9 - picc line placement on 3/11 Antimicrobials:  Anti-infectives (From admission, onward)   Start     Dose/Rate Route Frequency Ordered Stop   03/16/18 1130  penicillin G potassium 12 Million Units in dextrose 5 % 500 mL continuous infusion     12 Million Units 41.7 mL/hr over 12 Hours Intravenous Every 12 hours 03/16/18 1036 04/11/18 0859   03/15/18 1700  cefTRIAXone (ROCEPHIN) 2 g in sodium chloride 0.9 % 100 mL IVPB  Status:  Discontinued     2 g 200 mL/hr over 30 Minutes Intravenous Every 24 hours 03/15/18 1100 03/16/18 1657   03/14/18 0900  cefTRIAXone (ROCEPHIN) 1 g in sodium chloride 0.9 % 100 mL IVPB  Status:  Discontinued     1 g 200 mL/hr over 30 Minutes Intravenous Every 12 hours 03/14/18 0823 03/15/18 1100   03/13/18 1000  cefTRIAXone (ROCEPHIN) 1 g in sodium chloride 0.9 % 100 mL IVPB  Status:  Discontinued     1 g 200 mL/hr over 30 Minutes Intravenous Every 12 hours 03/13/18 0610 03/14/18 0823   03/13/18 1000  vancomycin (VANCOCIN) 1,500 mg in sodium chloride 0.9 % 500 mL IVPB  Status:  Discontinued     1,500 mg 250 mL/hr over 120 Minutes Intravenous Every 12 hours 03/13/18 0708 03/14/18 1311   03/13/18 0421  vancomycin (VANCOCIN) powder  Status:  Discontinued       As needed 03/13/18 0422 03/13/18 0500   03/13/18 0331  bacitracin 50,000 Units in sodium chloride 0.9 %  500 mL irrigation  Status:  Discontinued       As needed 03/13/18 0332 03/13/18 0500   03/13/18 0330  cefTRIAXone (ROCEPHIN) 1 g in sodium chloride 0.9 % 100 mL IVPB     1 g 200 mL/hr over 30 Minutes Intravenous  Once 03/13/18 0325 03/13/18 0430   03/13/18 0145  cefTRIAXone (ROCEPHIN) 2 g in sodium chloride 0.9 % 100 mL IVPB  Status:  Discontinued     2 g 200 mL/hr over 30 Minutes Intravenous  Once 03/13/18 0136 03/13/18 0150   03/13/18 0145  metroNIDAZOLE (FLAGYL) IVPB 500 mg  Status:  Discontinued     500 mg 100 mL/hr over 60 Minutes Intravenous Every 6 hours 03/13/18  0136 03/13/18 0150       Objective: Vitals:   04/04/18 0444 04/05/18 0616 04/06/18 0540 04/07/18 0947  BP: 130/77 110/70 118/68 135/87  Pulse: 90 68 74   Resp: 18 18 18    Temp: 98.3 F (36.8 C) 98 F (36.7 C) 97.9 F (36.6 C)   TempSrc: Oral Oral Oral   SpO2: 100% 98% 100%   Weight:      Height:        Intake/Output Summary (Last 24 hours) at 04/07/2018 1347 Last data filed at 04/07/2018 1030 Gross per 24 hour  Intake 1217 ml  Output -  Net 1217 ml   Filed Weights   03/12/18 2151  Weight: 113.4 kg    Examination: General exam: Appears comfortable  HEENT: PERRLA, oral mucosa moist, no sclera icterus or thrush Respiratory system: Clear to auscultation. Respiratory effort normal. Cardiovascular system: S1 & S2 heard,  No murmurs  Gastrointestinal system: Abdomen soft, non-tender, nondistended. Normal bowel sounds   Central nervous system: Alert and oriented. No focal neurological deficits. Extremities: No cyanosis, clubbing or edema Skin: No rashes or ulcers Psychiatry:  Mood & affect appropriate.   Data Reviewed: I have personally reviewed following labs and imaging studies  CBC: Recent Labs  Lab 04/05/18 0430  WBC 5.7  HGB 10.1*  HCT 33.6*  MCV 89.6  PLT 214   Basic Metabolic Panel: Recent Labs  Lab 04/05/18 0430  NA 137  K 4.0  CL 101  CO2 27  GLUCOSE 94  BUN 11  CREATININE 0.65  CALCIUM 8.7*   GFR: Estimated Creatinine Clearance: 158.5 mL/min (by C-G formula based on SCr of 0.65 mg/dL). Liver Function Tests: No results for input(s): AST, ALT, ALKPHOS, BILITOT, PROT, ALBUMIN in the last 168 hours. No results for input(s): LIPASE, AMYLASE in the last 168 hours. No results for input(s): AMMONIA in the last 168 hours. Coagulation Profile: No results for input(s): INR, PROTIME in the last 168 hours. Cardiac Enzymes: No results for input(s): CKTOTAL, CKMB, CKMBINDEX, TROPONINI in the last 168 hours. BNP (last 3 results) No results for input(s):  PROBNP in the last 8760 hours. HbA1C: No results for input(s): HGBA1C in the last 72 hours. CBG: No results for input(s): GLUCAP in the last 168 hours. Lipid Profile: No results for input(s): CHOL, HDL, LDLCALC, TRIG, CHOLHDL, LDLDIRECT in the last 72 hours. Thyroid Function Tests: No results for input(s): TSH, T4TOTAL, FREET4, T3FREE, THYROIDAB in the last 72 hours. Anemia Panel: No results for input(s): VITAMINB12, FOLATE, FERRITIN, TIBC, IRON, RETICCTPCT in the last 72 hours. Urine analysis:    Component Value Date/Time   COLORURINE YELLOW 03/15/2018 0328   APPEARANCEUR CLEAR 03/15/2018 0328   LABSPEC 1.015 03/15/2018 0328   PHURINE 6.0 03/15/2018 0328   GLUCOSEU NEGATIVE  03/15/2018 0328   HGBUR NEGATIVE 03/15/2018 0328   BILIRUBINUR NEGATIVE 03/15/2018 0328   KETONESUR NEGATIVE 03/15/2018 0328   PROTEINUR NEGATIVE 03/15/2018 0328   NITRITE NEGATIVE 03/15/2018 0328   LEUKOCYTESUR NEGATIVE 03/15/2018 0328   Sepsis Labs: (procalcitonin:4,lacticidven:4) )No results found for this or any previous visit (from the past 240 hour(s)).       Radiology Studies: No results found.    Scheduled Meds: . amLODipine  5 mg Oral Daily  . gabapentin  200 mg Oral TID  . ibuprofen  600 mg Oral TID  . pantoprazole  40 mg Oral BID  . polyethylene glycol  17 g Oral Daily  . sodium chloride flush  3 mL Intravenous Q12H  . vitamin B-12  100 mcg Oral Daily   Continuous Infusions: . sodium chloride    . penicillin g continuous IV infusion 12 Million Units (04/07/18 0950)     LOS: 25 days      Calvert Cantor, MD [facility] Pager: [email or web address] Password [email or web address] 04/07/2018, 1:47 PM

## 2018-04-07 NOTE — TOC Progression Note (Signed)
Transition of Care [facility]) - Progression Note    Patient Details  Name: Kaylob Jarosz MRN: [id number] Date of Birth: 1980-04-17  Transition of Care [facility]) CM/SW Contact  Nadene Rubins Adria Devon, RN Phone Number: 04/07/2018, 9:54 AM  Clinical Narrative:      Plan for patient to discharge to home 04/09/18. Asked MD if scripts could be sent to [facility] today and filled through [facility] Expected Discharge Plan: Home/Self Care Barriers to Discharge: Active Substance Use with PICC Line, Continued Medical Work up  Expected Discharge Plan and Services Expected Discharge Plan: Home/Self Care In-house Referral: Financial Counselor Discharge Planning Services: CM Consult   Living arrangements for the past 2 months: Single Family Home Expected Discharge Date: 03/17/18                         Social Determinants of Health (SDOH) Interventions    Readmission Risk Interventions No flowsheet data found.

## 2018-04-08 MED ORDER — CEFTRIAXONE SODIUM 1 G IJ SOLR
2.0000 g | Freq: Once | INTRAMUSCULAR | Status: AC
  Administered 2018-04-09: 2 g via INTRAMUSCULAR
  Filled 2018-04-08: qty 20

## 2018-04-08 NOTE — Progress Notes (Signed)
Pt with strep gordonii epidural abscess and has been on IV PCN cont infusion. The plan is transition him to PO amoxicillin after 4 wks. The plan is to discharge him tomorrow when he should complete the IV portion. D/w Dr Butler Denmark today and we will stop [facility] at 1900 tomorrow then give him a one time dose of ceftriaxone IM before dc. Then he can start his PO amoxicillin the next day.   Ulyses Southward, PharmD, BCIDP, AAHIVP, CPP Infectious Disease Pharmacist 04/08/2018 9:06 AM

## 2018-04-08 NOTE — Plan of Care (Signed)
  Problem: Pain Managment: Goal: General experience of comfort will improve Outcome: Progressing   Problem: Skin Integrity: Goal: Risk for impaired skin integrity will decrease Outcome: Progressing   

## 2018-04-08 NOTE — Progress Notes (Signed)
PROGRESS NOTE    James [name]   TKZ:[id number]  DOB: Dec 22, 1980  DOA: 03/12/2018 PCP: Joette Catching, MD   Brief Narrative:  James [name] is a 38 year old male with hypertension, IV drug abuse, chronic back pain who presented to the ED on 3/8 for bilateral lower extremity numbness, trouble ambulating and difficulty urinating for a couple of days.   An MRI in the ED showed>  Dorsal epidural phlegmon T1 through T4-5 with 8 x 10 x 34 mm epidural abscess from T2-3 through T4-5. Resultant severe cord compression and cord edema. 2. LEFT T8-9, RIGHT T9-10 and LEFT T10-11 facet septic arthritis. Associated paraspinal myositis. 3. 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   He was evaluated by Dr. Dutch Quint with neurosurgery who took him to the OR on 3/9 for thoracic laminectomy, resection of phlegmon.  3/9-ID was consulted commended vancomycin and ceftriaxone be continued 3/10 [facility] were consulted   Subjective: No complaints.    Assessment & Plan:   Active Problems:   Epidural abscess - Culture from epidural abscess grew Streptococcus gordonii - plan per ID is to cont IV PCN until 4/5 and then switch to Amoxil on 4/6 for 1 month- he will discharge tomorrow- discussed plan with pharmacy- plan to give 1 dose of Ceftriaxaone after stopping PCN infusion tomorrow evening and start Amoxil on 4/6.  - using Neurontin and Ibuprofen TID for pain    IVDU (intravenous drug user) -Admitted to last using heroin 3 days prior to admission states he was using it intermittently to help control his back pain    Essential hypertension - on Amldodipine  Low normal Vit B12 - level was 243 and is being replaced  Obesity  Body mass index is 36.92 kg/m.   Time spent in minutes:  35 min DVT prophylaxis: SCDs Code Status: Full code Family Communication:  Disposition Plan: home after last dose of antibiotics on 4/5 Consultants:    ID  Initially admitted by Neuro surgery Procedures:  - emergent T3-4 decompressive laminectomy with evacuation of epidural abscess, resection of epidural phlegmon with microdissection onb 3/9 - picc line placement on 3/11 Antimicrobials:  Anti-infectives (From admission, onward)   Start     Dose/Rate Route Frequency Ordered Stop   04/09/18 1900  cefTRIAXone (ROCEPHIN) injection 2 g     2 g Intramuscular  Once 04/08/18 0902     03/16/18 1130  penicillin G potassium 12 Million Units in dextrose 5 % 500 mL continuous infusion     12 Million Units 41.7 mL/hr over 12 Hours Intravenous Every 12 hours 03/16/18 1036 04/09/18 1900   03/15/18 1700  cefTRIAXone (ROCEPHIN) 2 g in sodium chloride 0.9 % 100 mL IVPB  Status:  Discontinued     2 g 200 mL/hr over 30 Minutes Intravenous Every 24 hours 03/15/18 1100 03/16/18 1657   03/14/18 0900  cefTRIAXone (ROCEPHIN) 1 g in sodium chloride 0.9 % 100 mL IVPB  Status:  Discontinued     1 g 200 mL/hr over 30 Minutes Intravenous Every 12 hours 03/14/18 0823 03/15/18 1100   03/13/18 1000  cefTRIAXone (ROCEPHIN) 1 g in sodium chloride 0.9 % 100 mL IVPB  Status:  Discontinued     1 g 200 mL/hr over 30 Minutes Intravenous Every 12 hours 03/13/18 0610 03/14/18 0823   03/13/18 1000  vancomycin (VANCOCIN) 1,500 mg in sodium chloride 0.9 % 500 mL IVPB  Status:  Discontinued     1,500  mg 250 mL/hr over 120 Minutes Intravenous Every 12 hours 03/13/18 0708 03/14/18 1311   03/13/18 0421  vancomycin (VANCOCIN) powder  Status:  Discontinued       As needed 03/13/18 0422 03/13/18 0500   03/13/18 0331  bacitracin 50,000 Units in sodium chloride 0.9 % 500 mL irrigation  Status:  Discontinued       As needed 03/13/18 0332 03/13/18 0500   03/13/18 0330  cefTRIAXone (ROCEPHIN) 1 g in sodium chloride 0.9 % 100 mL IVPB     1 g 200 mL/hr over 30 Minutes Intravenous  Once 03/13/18 0325 03/13/18 0430   03/13/18 0145  cefTRIAXone (ROCEPHIN) 2 g in sodium chloride 0.9 % 100 mL  IVPB  Status:  Discontinued     2 g 200 mL/hr over 30 Minutes Intravenous  Once 03/13/18 0136 03/13/18 0150   03/13/18 0145  metroNIDAZOLE (FLAGYL) IVPB 500 mg  Status:  Discontinued     500 mg 100 mL/hr over 60 Minutes Intravenous Every 6 hours 03/13/18 0136 03/13/18 0150       Objective: Vitals:   04/05/18 0616 04/06/18 0540 04/07/18 0947 04/08/18 0616  BP: 110/70 118/68 135/87 107/68  Pulse: 68 74  (!) 57  Resp: 18 18  16   Temp: 98 F (36.7 C) 97.9 F (36.6 C)  98.1 F (36.7 C)  TempSrc: Oral Oral  Oral  SpO2: 98% 100%  97%  Weight:      Height:        Intake/Output Summary (Last 24 hours) at 04/08/2018 1103 Last data filed at 04/08/2018 1012 Gross per 24 hour  Intake 1279.5 ml  Output -  Net 1279.5 ml   Filed Weights   03/12/18 2151  Weight: 113.4 kg    Examination: General exam: Appears comfortable  HEENT: PERRLA, oral mucosa moist, no sclera icterus or thrush Respiratory system: Clear to auscultation. Respiratory effort normal. Cardiovascular system: S1 & S2 heard,  No murmurs  Gastrointestinal system: Abdomen soft, non-tender, nondistended. Normal bowel sounds   Central nervous system: Alert and oriented. No focal neurological deficits. Extremities: No cyanosis, clubbing or edema Skin: No rashes or ulcers Psychiatry:  Mood & affect appropriate.   Data Reviewed: I have personally reviewed following labs and imaging studies  CBC: Recent Labs  Lab 04/05/18 0430  WBC 5.7  HGB 10.1*  HCT 33.6*  MCV 89.6  PLT 214   Basic Metabolic Panel: Recent Labs  Lab 04/05/18 0430  NA 137  K 4.0  CL 101  CO2 27  GLUCOSE 94  BUN 11  CREATININE 0.65  CALCIUM 8.7*   GFR: Estimated Creatinine Clearance: 158.5 mL/min (by C-G formula based on SCr of 0.65 mg/dL). Liver Function Tests: No results for input(s): AST, ALT, ALKPHOS, BILITOT, PROT, ALBUMIN in the last 168 hours. No results for input(s): LIPASE, AMYLASE in the last 168 hours. No results for input(s):  AMMONIA in the last 168 hours. Coagulation Profile: No results for input(s): INR, PROTIME in the last 168 hours. Cardiac Enzymes: No results for input(s): CKTOTAL, CKMB, CKMBINDEX, TROPONINI in the last 168 hours. BNP (last 3 results) No results for input(s): PROBNP in the last 8760 hours. HbA1C: No results for input(s): HGBA1C in the last 72 hours. CBG: No results for input(s): GLUCAP in the last 168 hours. Lipid Profile: No results for input(s): CHOL, HDL, LDLCALC, TRIG, CHOLHDL, LDLDIRECT in the last 72 hours. Thyroid Function Tests: No results for input(s): TSH, T4TOTAL, FREET4, T3FREE, THYROIDAB in the last 72 hours. Anemia  Panel: No results for input(s): VITAMINB12, FOLATE, FERRITIN, TIBC, IRON, RETICCTPCT in the last 72 hours. Urine analysis:    Component Value Date/Time   COLORURINE YELLOW 03/15/2018 0328   APPEARANCEUR CLEAR 03/15/2018 0328   LABSPEC 1.015 03/15/2018 0328   PHURINE 6.0 03/15/2018 0328   GLUCOSEU NEGATIVE 03/15/2018 0328   HGBUR NEGATIVE 03/15/2018 0328   BILIRUBINUR NEGATIVE 03/15/2018 0328   KETONESUR NEGATIVE 03/15/2018 0328   PROTEINUR NEGATIVE 03/15/2018 0328   NITRITE NEGATIVE 03/15/2018 0328   LEUKOCYTESUR NEGATIVE 03/15/2018 0328   Sepsis Labs: @LABRCNTIP (procalcitonin:4,lacticidven:4) )No results found for this or any previous visit (from the past 240 hour(s)).       Radiology Studies: No results found.    Scheduled Meds: . amLODipine  5 mg Oral Daily  . [START ON 04/09/2018] cefTRIAXone (ROCEPHIN) IM  2 g Intramuscular Once  . gabapentin  200 mg Oral TID  . ibuprofen  600 mg Oral TID  . pantoprazole  40 mg Oral BID  . polyethylene glycol  17 g Oral Daily  . sodium chloride flush  3 mL Intravenous Q12H  . vitamin B-12  100 mcg Oral Daily   Continuous Infusions: . sodium chloride    . penicillin g continuous IV infusion 12 Million Units (04/08/18 0828)     LOS: 26 days      Calvert CantorSaima Chemeka Filice, MD [facility] Pager:  [email or web address] Password [email or web address] 04/08/2018, 11:03 AM

## 2018-04-09 DIAGNOSIS — E669 Obesity, unspecified: Secondary | ICD-10-CM

## 2018-04-09 DIAGNOSIS — Z6830 Body mass index (BMI) 30.0-30.9, adult: Secondary | ICD-10-CM

## 2018-04-09 DIAGNOSIS — E6609 Other obesity due to excess calories: Secondary | ICD-10-CM

## 2018-04-09 DIAGNOSIS — E538 Deficiency of other specified B group vitamins: Secondary | ICD-10-CM

## 2018-04-09 MED ORDER — IBUPROFEN 200 MG PO TABS: 400.0000 mg | ORAL_TABLET | ORAL | 0 refills | Status: DC | PRN

## 2018-04-09 MED ORDER — CYANOCOBALAMIN 100 MCG PO TABS: 100.0000 ug | ORAL_TABLET | Freq: Every day | ORAL | 0 refills | Status: AC

## 2018-04-09 MED ORDER — AMOXICILLIN 875 MG PO TABS: 875.0000 mg | ORAL_TABLET | Freq: Two times a day (BID) | ORAL | 0 refills | Status: DC

## 2018-04-09 MED ORDER — GABAPENTIN 100 MG PO CAPS: 200.0000 mg | ORAL_CAPSULE | Freq: Three times a day (TID) | ORAL | 0 refills | Status: DC

## 2018-04-09 NOTE — Progress Notes (Signed)
Pt discharged at this time.

## 2018-04-09 NOTE — Discharge Summary (Signed)
Physician Discharge Summary  James [name] YTW:[id number] DOB: 03-Apr-1980 DOA: 03/12/2018  PCP: Joette Catching, MD  Admit date: 03/12/2018 Discharge date: 04/09/2018  Admitted From: *home Disposition:  home   Recommendations for Outpatient Follow-up:  1. F/u on HTN and weight loss    Discharge Condition:  stable   CODE STATUS:  Full code   Diet recommendation:  Heart healthy Consultations:  ID   Admitted by NS   Discharge Diagnoses:  Principal Problem:   Epidural abscess Active Problems:   IVDU (intravenous drug user)   Essential hypertension   Low serum vitamin B12   Obesity      Brief Summary: James [name] is a 38 year old male with hypertension, IV drug abuse, chronic back pain who presented to the ED on 3/8 for bilateral lower extremity numbness, trouble ambulating and difficulty urinating for a couple of days.   An MRI in the ED showed>  Dorsal epidural phlegmon T1 through T4-5 with 8 x 10 x 34 mm epidural abscess from T2-3 through T4-5. Resultant severe cord compression and cord edema. 2. LEFT T8-9, RIGHT T9-10 and LEFT T10-11 facet septic arthritis. Associated paraspinal myositis. 3. 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   He was evaluated by Dr. Dutch Quint with neurosurgery who took him to the OR on 3/9 for thoracic laminectomy, resection of phlegmon.  3/9-ID was consulted commended vancomycin and ceftriaxone be continued 3/10 [facility] were consulted    Hospital Course:  Active Problems:   Epidural abscess - Culture from epidural abscess grew Streptococcus gordonii - plan per ID is to cont IV PCN until 4/5 and then switch to Amoxil on 4/6 for 1 month - using Neurontin and Ibuprofen TID for pain - He will d/c home tonight.- PCN infusion will be stopped this evening and he will be given a dose of 2 gm of Ceftriaxone to help transition to Amoxil which he will start tomorrow - PICC line to  be removed    IVDU (intravenous drug user) -Admitted to last using heroin 3 days prior to admission states he was using it intermittently to help control his back pain    Essential hypertension - resume Lisinopril which he was using as outpt  Low normal Vit B12 - level was 243 and is being replaced  Obesity  Body mass index is 36.92 kg/m.    Discharge Exam: Vitals:   04/08/18 1929 04/09/18 0421  BP: (!) 146/94 (!) 122/91  Pulse: 66 65  Resp: 16 16  Temp: 98.3 F (36.8 C) 97.7 F (36.5 C)  SpO2: 99% 100%   Vitals:   04/07/18 0947 04/08/18 0616 04/08/18 1929 04/09/18 0421  BP: 135/87 107/68 (!) 146/94 (!) 122/91  Pulse:  (!) 57 66 65  Resp:  16 16 16   Temp:  98.1 F (36.7 C) 98.3 F (36.8 C) 97.7 F (36.5 C)  TempSrc:  Oral Oral Oral  SpO2:  97% 99% 100%  Weight:      Height:        General: Pt is alert, awake, not in acute distress Cardiovascular: RRR, S1/S2 +, no rubs, no gallops Respiratory: CTA bilaterally, no wheezing, no rhonchi Abdominal: Soft, NT, ND, bowel sounds + Extremities: no edema, no cyanosis   Discharge Instructions  Discharge Instructions    Diet - low sodium heart healthy   Complete by:  As directed    Increase activity slowly   Complete by:  As directed  Allergies as of 04/09/2018   No Known Allergies     Medication List    STOP taking these medications   cyclobenzaprine 10 MG tablet Commonly known as:  FLEXERIL     TAKE these medications   amoxicillin 875 MG tablet Commonly known as:  AMOXIL Take 1 tablet (875 mg total) by mouth 2 (two) times daily.   cyanocobalamin 100 MCG tablet Take 1 tablet (100 mcg total) by mouth daily.   gabapentin 100 MG capsule Commonly known as:  NEURONTIN Take 2 capsules (200 mg total) by mouth 3 (three) times daily.   ibuprofen 200 MG tablet Commonly known as:  ADVIL,MOTRIN Take 2 tablets (400 mg total) by mouth every 4 (four) hours as needed (back pain). What changed:  how much  to take   lisinopril 10 MG tablet Commonly known as:  PRINIVIL,ZESTRIL Take 10 mg by mouth at bedtime.            Durable Medical Equipment  (From admission, onward)         Start     Ordered   03/13/18 0611  DME 3 n 1  Once     03/13/18 0610         Follow-up Information    [facility] Schedule an appointment as soon as possible for a visit.   Contact information: [address] [place] [address] [phone]       Gardiner Barefoot, MD Follow up.   Specialty:  Infectious Diseases Contact information: [address] [place] [address] [phone]        Julio Sicks, MD Follow up.   Specialty:  Neurosurgery Contact information: [address] [place] [address] [phone]          No Known Allergies   Procedures/Studies: - emergent T3-4 decompressive laminectomy with evacuation of epidural abscess, resection of epidural phlegmon with microdissection onb 3/9 - picc line placement on 3/11  Mr Cervical Spine W Or Wo Contrast  Result Date: 03/13/2018 CLINICAL DATA:  Severe pain, numbness below the waist since March 10, 2018. Assess for cauda equina syndrome. EXAM: MRI CERVICAL AND THORACIC SPINE WITHOUT AND WITH CONTRAST TECHNIQUE: Multiplanar and multiecho pulse sequences of the cervical spine, to include the craniocervical junction and cervicothoracic junction, and thoracic spine, were obtained without and with intravenous contrast. CONTRAST:  10 cc Gadavist COMPARISON:  None. FINDINGS: MRI CERVICAL SPINE FINDINGS-moderately motion degraded examination. ALIGNMENT: Straightened cervical lordosis.  No malalignment. VERTEBRAE/DISCS: Vertebral bodies are intact. Intervertebral disc morphology's and signal are normal. CORD:Cervical spinal cord is normal morphology and signal characteristics from the cervicomedullary junction to level of T2-3, the most caudal well visualized level. POSTERIOR  FOSSA, VERTEBRAL ARTERIES, PARASPINAL TISSUES: No MR findings of ligamentous injury. Vertebral artery flow voids present. Included posterior fossa and paraspinal soft tissues are normal. DISC LEVELS: C2-3: Large RIGHT subarticular disc protrusion. No canal stenosis. Severe RIGHT neural foraminal narrowing. C3-4: Annular bulging eccentric laterally. No canal stenosis. Moderate RIGHT neural foraminal narrowing. C4-5: Small LEFT central disc protrusion without canal stenosis. Mild suspected LEFT neural foraminal narrowing. C5-6: Moderate LEFT subarticular disc protrusion. No canal stenosis. Severe LEFT neural foraminal narrowing. C6-7: Annular bulging asymmetric to LEFT. No canal stenosis. Mild LEFT neural foraminal narrowing. C7-T1: No disc bulge, canal stenosis nor neural foraminal narrowing. MRI THORACIC SPINE FINDINGS ALIGNMENT: Maintenance of the thoracic kyphosis. No malalignment. VERTEBRAE/DISCS: Vertebral bodies are intact. Multilevel mild disc desiccation and chronic  discogenic endplate changes. Bright STIR signal and enhancement within the posterior elements of T3. No abnormal disc enhancement. Enhancing bright STIR signal LEFT T8-9, RIGHT T9-10 and LEFT T10-11 facet with masslike appearance. RIGHT third rib 2.8 x 2.5 cm enhancing mass. CORD: Dorsal epidural phlegmon from T1 through T4-5, 8 x 10 x 34 mm (AP by transverse by cc) dorsal epidural abscess from T2-3 through T4-5. Cord compression from T3 through T4 with mild cord edema at this level. No syrinx. PREVERTEBRAL AND PARASPINAL SOFT TISSUES: Interstitial STIR signal and enhancement within the interspinous space and paraspinal muscles at T1-2 through T4-5 consistent with infection. DISC LEVELS: Small broad-based disc bulge at T6-7. Moderate LEFT central to subarticular disc protrusion T8-9. IMPRESSION: MRI cervical spine: 1. Moderately motion degraded examination. No acute osseous process. 2. No canal stenosis. Neural foraminal narrowing C2-3 through  C6-7: Severe on the RIGHT at C2-3 and severe on the LEFT at C5-6 comment; this may be overestimated by motion artifact. MRI thoracic spine: 1. Dorsal epidural phlegmon T1 through T4-5 with 8 x 10 x 34 mm epidural abscess from T2-3 through T4-5. Resultant severe cord compression and cord edema. 2. LEFT T8-9, RIGHT T9-10 and LEFT T10-11 facet septic arthritis. Associated paraspinal myositis. 3. 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 Consider CT chest with contrast. Critical Value/emergent results were called by telephone at the time of interpretation on 03/13/2018 at 12:59 am to Dr. Melene Plan , who verbally acknowledged these results. Electronically Signed   By: Awilda Metro M.D.   On: 03/13/2018 01:00   Mr Thoracic Spine W Wo Contrast  Result Date: 03/13/2018 CLINICAL DATA:  Severe pain, numbness below the waist since March 10, 2018. Assess for cauda equina syndrome. EXAM: MRI CERVICAL AND THORACIC SPINE WITHOUT AND WITH CONTRAST TECHNIQUE: Multiplanar and multiecho pulse sequences of the cervical spine, to include the craniocervical junction and cervicothoracic junction, and thoracic spine, were obtained without and with intravenous contrast. CONTRAST:  10 cc Gadavist COMPARISON:  None. FINDINGS: MRI CERVICAL SPINE FINDINGS-moderately motion degraded examination. ALIGNMENT: Straightened cervical lordosis.  No malalignment. VERTEBRAE/DISCS: Vertebral bodies are intact. Intervertebral disc morphology's and signal are normal. CORD:Cervical spinal cord is normal morphology and signal characteristics from the cervicomedullary junction to level of T2-3, the most caudal well visualized level. POSTERIOR FOSSA, VERTEBRAL ARTERIES, PARASPINAL TISSUES: No MR findings of ligamentous injury. Vertebral artery flow voids present. Included posterior fossa and paraspinal soft tissues are normal. DISC LEVELS: C2-3: Large RIGHT subarticular disc protrusion. No canal  stenosis. Severe RIGHT neural foraminal narrowing. C3-4: Annular bulging eccentric laterally. No canal stenosis. Moderate RIGHT neural foraminal narrowing. C4-5: Small LEFT central disc protrusion without canal stenosis. Mild suspected LEFT neural foraminal narrowing. C5-6: Moderate LEFT subarticular disc protrusion. No canal stenosis. Severe LEFT neural foraminal narrowing. C6-7: Annular bulging asymmetric to LEFT. No canal stenosis. Mild LEFT neural foraminal narrowing. C7-T1: No disc bulge, canal stenosis nor neural foraminal narrowing. MRI THORACIC SPINE FINDINGS ALIGNMENT: Maintenance of the thoracic kyphosis. No malalignment. VERTEBRAE/DISCS: Vertebral bodies are intact. Multilevel mild disc desiccation and chronic discogenic endplate changes. Bright STIR signal and enhancement within the posterior elements of T3. No abnormal disc enhancement. Enhancing bright STIR signal LEFT T8-9, RIGHT T9-10 and LEFT T10-11 facet with masslike appearance. RIGHT third rib 2.8 x 2.5 cm enhancing mass. CORD: Dorsal epidural phlegmon from T1 through T4-5, 8 x 10 x 34 mm (AP by transverse by cc) dorsal epidural abscess from T2-3 through T4-5. Cord  compression from T3 through T4 with mild cord edema at this level. No syrinx. PREVERTEBRAL AND PARASPINAL SOFT TISSUES: Interstitial STIR signal and enhancement within the interspinous space and paraspinal muscles at T1-2 through T4-5 consistent with infection. DISC LEVELS: Small broad-based disc bulge at T6-7. Moderate LEFT central to subarticular disc protrusion T8-9. IMPRESSION: MRI cervical spine: 1. Moderately motion degraded examination. No acute osseous process. 2. No canal stenosis. Neural foraminal narrowing C2-3 through C6-7: Severe on the RIGHT at C2-3 and severe on the LEFT at C5-6 comment; this may be overestimated by motion artifact. MRI thoracic spine: 1. Dorsal epidural phlegmon T1 through T4-5 with 8 x 10 x 34 mm epidural abscess from T2-3 through T4-5. Resultant  severe cord compression and cord edema. 2. LEFT T8-9, RIGHT T9-10 and LEFT T10-11 facet septic arthritis. Associated paraspinal myositis. 3. 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 Consider CT chest with contrast. Critical Value/emergent results were called by telephone at the time of interpretation on 03/13/2018 at 12:59 am to Dr. Melene Plan , who verbally acknowledged these results. Electronically Signed   By: Awilda Metro M.D.   On: 03/13/2018 01:00   [place] Ekg Site Rite  Result Date: 03/13/2018 If Site Rite image not attached, placement could not be confirmed due to current cardiac rhythm.    The results of significant diagnostics from this hospitalization (including imaging, microbiology, ancillary and laboratory) are listed below for reference.     Microbiology: No results found for this or any previous visit (from the past 240 hour(s)).   Labs: BNP (last 3 results) No results for input(s): BNP in the last 8760 hours. Basic Metabolic Panel: Recent Labs  Lab 04/05/18 0430  NA 137  K 4.0  CL 101  CO2 27  GLUCOSE 94  BUN 11  CREATININE 0.65  CALCIUM 8.7*   Liver Function Tests: No results for input(s): AST, ALT, ALKPHOS, BILITOT, PROT, ALBUMIN in the last 168 hours. No results for input(s): LIPASE, AMYLASE in the last 168 hours. No results for input(s): AMMONIA in the last 168 hours. CBC: Recent Labs  Lab 04/05/18 0430  WBC 5.7  HGB 10.1*  HCT 33.6*  MCV 89.6  PLT 214   Cardiac Enzymes: No results for input(s): CKTOTAL, CKMB, CKMBINDEX, TROPONINI in the last 168 hours. BNP: Invalid input(s): POCBNP CBG: No results for input(s): GLUCAP in the last 168 hours. D-Dimer No results for input(s): DDIMER in the last 72 hours. Hgb A1c No results for input(s): HGBA1C in the last 72 hours. Lipid Profile No results for input(s): CHOL, HDL, LDLCALC, TRIG, CHOLHDL, LDLDIRECT in the last 72 hours. Thyroid function  studies No results for input(s): TSH, T4TOTAL, T3FREE, THYROIDAB in the last 72 hours.  Invalid input(s): FREET3 Anemia work up No results for input(s): VITAMINB12, FOLATE, FERRITIN, TIBC, IRON, RETICCTPCT in the last 72 hours. Urinalysis    Component Value Date/Time   COLORURINE YELLOW 03/15/2018 0328   APPEARANCEUR CLEAR 03/15/2018 0328   LABSPEC 1.015 03/15/2018 0328   PHURINE 6.0 03/15/2018 0328   GLUCOSEU NEGATIVE 03/15/2018 0328   HGBUR NEGATIVE 03/15/2018 0328   BILIRUBINUR NEGATIVE 03/15/2018 0328   KETONESUR NEGATIVE 03/15/2018 0328   PROTEINUR NEGATIVE 03/15/2018 0328   NITRITE NEGATIVE 03/15/2018 0328   LEUKOCYTESUR NEGATIVE 03/15/2018 0328   Sepsis Labs Invalid input(s): PROCALCITONIN,  WBC,  LACTICIDVEN Microbiology No results found for this or any previous visit (from the past 240 hour(s)).   Time coordinating discharge in  minutes: 65  SIGNED:   Calvert CantorSaima Harlan Ervine, MD  [facility] 04/09/2018, 8:08 AM Pager   If 7PM-7AM, please contact night-coverage [email or web address] Password [email or web address]

## 2018-04-09 NOTE — Care Management (Signed)
Pt will be discharged this evening.  Pt given MATCH letter to fill prescriptions for $3/each at [facility] in [facility].  Pt given printed prescriptions by Dr. Butler Denmark. Pt agrees that he can pay $9 with no problem and can use ibuprofen for pain tonight if needed as pharmacy will be closed when he is discharged.    Pt states he does not need a 3n1.

## 2018-04-09 NOTE — Progress Notes (Signed)
1900 Discharge instructions given to pt. Verbalized understanding. PICC was removed by IV team, site dressing dry and intact. Pt waiting for his ride.

## 2018-04-29 ENCOUNTER — Emergency Department (HOSPITAL_COMMUNITY): Admission: EM | Admit: 2018-04-29 | Discharge: 2018-04-29 | Discharge status: Against Medical Advice | Payer: Self-pay

## 2019-09-11 ENCOUNTER — Encounter (HOSPITAL_COMMUNITY)
Admission: EM | Disposition: A | Discharge status: Rehabilitation Facility | Payer: Self-pay | Source: Home / Self Care | Attending: Orthopedic Surgery

## 2019-09-11 ENCOUNTER — Emergency Department (HOSPITAL_COMMUNITY): Payer: No Typology Code available for payment source

## 2019-09-11 ENCOUNTER — Emergency Department (HOSPITAL_COMMUNITY): Payer: No Typology Code available for payment source | Admitting: Anesthesiology

## 2019-09-11 ENCOUNTER — Inpatient Hospital Stay (HOSPITAL_COMMUNITY): Payer: No Typology Code available for payment source

## 2019-09-11 ENCOUNTER — Encounter (HOSPITAL_COMMUNITY): Payer: Self-pay

## 2019-09-11 ENCOUNTER — Inpatient Hospital Stay (HOSPITAL_COMMUNITY)
Admission: EM | Admit: 2019-09-11 | Discharge: 2019-09-22 | DRG: 492 | Disposition: A | Discharge status: Rehabilitation Facility | Payer: No Typology Code available for payment source | Attending: Orthopedic Surgery | Admitting: Orthopedic Surgery

## 2019-09-11 ENCOUNTER — Other Ambulatory Visit: Payer: Self-pay

## 2019-09-11 DIAGNOSIS — Z419 Encounter for procedure for purposes other than remedying health state, unspecified: Secondary | ICD-10-CM

## 2019-09-11 DIAGNOSIS — F1120 Opioid dependence, uncomplicated: Secondary | ICD-10-CM | POA: Diagnosis present

## 2019-09-11 DIAGNOSIS — S32402A Unspecified fracture of left acetabulum, initial encounter for closed fracture: Secondary | ICD-10-CM | POA: Diagnosis present

## 2019-09-11 DIAGNOSIS — F1121 Opioid dependence, in remission: Secondary | ICD-10-CM | POA: Diagnosis present

## 2019-09-11 DIAGNOSIS — T1490XA Injury, unspecified, initial encounter: Secondary | ICD-10-CM | POA: Diagnosis present

## 2019-09-11 DIAGNOSIS — Y92410 Unspecified street and highway as the place of occurrence of the external cause: Secondary | ICD-10-CM | POA: Diagnosis not present

## 2019-09-11 DIAGNOSIS — I10 Essential (primary) hypertension: Secondary | ICD-10-CM | POA: Diagnosis present

## 2019-09-11 DIAGNOSIS — S42292A Other displaced fracture of upper end of left humerus, initial encounter for closed fracture: Secondary | ICD-10-CM

## 2019-09-11 DIAGNOSIS — Z20822 Contact with and (suspected) exposure to covid-19: Secondary | ICD-10-CM | POA: Diagnosis present

## 2019-09-11 DIAGNOSIS — E559 Vitamin D deficiency, unspecified: Secondary | ICD-10-CM | POA: Diagnosis present

## 2019-09-11 DIAGNOSIS — M75122 Complete rotator cuff tear or rupture of left shoulder, not specified as traumatic: Secondary | ICD-10-CM | POA: Diagnosis present

## 2019-09-11 DIAGNOSIS — G8918 Other acute postprocedural pain: Secondary | ICD-10-CM | POA: Diagnosis not present

## 2019-09-11 DIAGNOSIS — S73005A Unspecified dislocation of left hip, initial encounter: Secondary | ICD-10-CM | POA: Diagnosis not present

## 2019-09-11 DIAGNOSIS — T07XXXA Unspecified multiple injuries, initial encounter: Secondary | ICD-10-CM | POA: Diagnosis not present

## 2019-09-11 DIAGNOSIS — S32422A Displaced fracture of posterior wall of left acetabulum, initial encounter for closed fracture: Secondary | ICD-10-CM

## 2019-09-11 DIAGNOSIS — R3911 Hesitancy of micturition: Secondary | ICD-10-CM | POA: Diagnosis not present

## 2019-09-11 DIAGNOSIS — S32402D Unspecified fracture of left acetabulum, subsequent encounter for fracture with routine healing: Secondary | ICD-10-CM | POA: Diagnosis not present

## 2019-09-11 DIAGNOSIS — B1920 Unspecified viral hepatitis C without hepatic coma: Secondary | ICD-10-CM | POA: Diagnosis present

## 2019-09-11 DIAGNOSIS — S32462A Displaced associated transverse-posterior fracture of left acetabulum, initial encounter for closed fracture: Secondary | ICD-10-CM | POA: Diagnosis present

## 2019-09-11 DIAGNOSIS — M898X9 Other specified disorders of bone, unspecified site: Secondary | ICD-10-CM | POA: Diagnosis present

## 2019-09-11 DIAGNOSIS — D62 Acute posthemorrhagic anemia: Secondary | ICD-10-CM | POA: Diagnosis not present

## 2019-09-11 DIAGNOSIS — S42202A Unspecified fracture of upper end of left humerus, initial encounter for closed fracture: Secondary | ICD-10-CM | POA: Diagnosis present

## 2019-09-11 DIAGNOSIS — Z6836 Body mass index (BMI) 36.0-36.9, adult: Secondary | ICD-10-CM | POA: Diagnosis not present

## 2019-09-11 DIAGNOSIS — S42242A 4-part fracture of surgical neck of left humerus, initial encounter for closed fracture: Secondary | ICD-10-CM | POA: Diagnosis present

## 2019-09-11 DIAGNOSIS — T148XXA Other injury of unspecified body region, initial encounter: Secondary | ICD-10-CM

## 2019-09-11 HISTORY — PX: HIP CLOSED REDUCTION: SHX983

## 2019-09-11 HISTORY — PX: INSERTION OF TRACTION PIN: SHX6560

## 2019-09-11 HISTORY — DX: Other psychoactive substance abuse, uncomplicated: F19.10

## 2019-09-11 HISTORY — DX: Inflammatory liver disease, unspecified: K75.9

## 2019-09-11 LAB — HEPATITIS PANEL, ACUTE
HCV Ab: REACTIVE — AB
Hep A IgM: NONREACTIVE
Hep B C IgM: NONREACTIVE
Hepatitis B Surface Ag: NONREACTIVE

## 2019-09-11 LAB — COMPREHENSIVE METABOLIC PANEL
ALT: 21 U/L (ref 0–44)
AST: 34 U/L (ref 15–41)
Albumin: 3.5 g/dL (ref 3.5–5.0)
Alkaline Phosphatase: 114 U/L (ref 38–126)
Anion gap: 10 (ref 5–15)
BUN: 15 mg/dL (ref 6–20)
CO2: 23 mmol/L (ref 22–32)
Calcium: 9 mg/dL (ref 8.9–10.3)
Chloride: 104 mmol/L (ref 98–111)
Creatinine, Ser: 0.91 mg/dL (ref 0.61–1.24)
GFR calc Af Amer: >60 mL/min (ref >60)
GFR calc non Af Amer: >60 mL/min (ref >60)
Glucose, Bld: 135 mg/dL — ABNORMAL HIGH (ref 70–99)
Potassium: 3.7 mmol/L (ref 3.5–5.1)
Sodium: 137 mmol/L (ref 135–145)
Total Bilirubin: 0.4 mg/dL (ref 0.3–1.2)
Total Protein: 7.6 g/dL (ref 6.5–8.1)

## 2019-09-11 LAB — SAMPLE TO BLOOD BANK

## 2019-09-11 LAB — URINALYSIS, ROUTINE W REFLEX MICROSCOPIC
Bacteria, UA: NONE SEEN
Bilirubin Urine: NEGATIVE
Glucose, UA: NEGATIVE mg/dL
Hgb urine dipstick: NEGATIVE
Ketones, ur: NEGATIVE mg/dL
Nitrite: NEGATIVE
Protein, ur: NEGATIVE mg/dL
Specific Gravity, Urine: 1.015 (ref 1.005–1.030)
pH: 5 (ref 5.0–8.0)

## 2019-09-11 LAB — ETHANOL: Alcohol, Ethyl (B): <10 mg/dL (ref <10)

## 2019-09-11 LAB — CBC
HCT: 42.5 % (ref 39.0–52.0)
Hemoglobin: 13.7 g/dL (ref 13.0–17.0)
MCH: 27.6 pg (ref 26.0–34.0)
MCHC: 32.2 g/dL (ref 30.0–36.0)
MCV: 85.7 fL (ref 80.0–100.0)
Platelets: 213 10*3/uL (ref 150–400)
RBC: 4.96 MIL/uL (ref 4.22–5.81)
RDW: 12.8 % (ref 11.5–15.5)
WBC: 9.7 10*3/uL (ref 4.0–10.5)
nRBC: 0 % (ref 0.0–0.2)

## 2019-09-11 LAB — PROTIME-INR
INR: 1 (ref 0.8–1.2)
Prothrombin Time: 13 seconds (ref 11.4–15.2)

## 2019-09-11 LAB — SARS CORONAVIRUS 2 BY RT PCR (HOSPITAL ORDER, PERFORMED IN ~~LOC~~ HOSPITAL LAB): SARS Coronavirus 2: NEGATIVE

## 2019-09-11 LAB — LACTIC ACID, PLASMA: Lactic Acid, Venous: 3.5 mmol/L (ref 0.5–1.9)

## 2019-09-11 SURGERY — CLOSED REDUCTION, HIP
Anesthesia: General | Site: Leg Lower | Laterality: Left

## 2019-09-11 MED ORDER — LACTATED RINGERS IV SOLN: INTRAVENOUS | Status: DC

## 2019-09-11 MED ORDER — SUGAMMADEX SODIUM 200 MG/2ML IV SOLN
INTRAVENOUS | Status: DC | PRN
  Administered 2019-09-11: 300 mg via INTRAVENOUS

## 2019-09-11 MED ORDER — CHLORHEXIDINE GLUCONATE 4 % EX LIQD: 60.0000 mL | Freq: Once | CUTANEOUS | Status: DC

## 2019-09-11 MED ORDER — METHADONE HCL 10 MG PO TABS
115.0000 mg | ORAL_TABLET | Freq: Every day | ORAL | Status: DC
  Administered 2019-09-11 – 2019-09-22 (×12): 115 mg via ORAL
  Filled 2019-09-11 (×12): qty 12

## 2019-09-11 MED ORDER — CHLORHEXIDINE GLUCONATE 0.12 % MT SOLN
OROMUCOSAL | Status: AC
  Administered 2019-09-11: 15 mL via OROMUCOSAL
  Filled 2019-09-11: qty 15

## 2019-09-11 MED ORDER — MIDAZOLAM HCL 2 MG/2ML IJ SOLN
INTRAMUSCULAR | Status: DC | PRN
  Administered 2019-09-11: 2 mg via INTRAVENOUS

## 2019-09-11 MED ORDER — HYDROMORPHONE HCL 1 MG/ML IJ SOLN
0.5000 mg | INTRAMUSCULAR | Status: DC | PRN
  Administered 2019-09-12 – 2019-09-13 (×5): 1 mg via INTRAVENOUS
  Filled 2019-09-11 (×5): qty 1

## 2019-09-11 MED ORDER — HYDROMORPHONE HCL 1 MG/ML IJ SOLN
INTRAMUSCULAR | Status: DC | PRN
  Administered 2019-09-11 (×2): .5 mg via INTRAVENOUS

## 2019-09-11 MED ORDER — ONDANSETRON HCL 4 MG/2ML IJ SOLN
INTRAMUSCULAR | Status: AC
  Filled 2019-09-11: qty 4

## 2019-09-11 MED ORDER — HYDROMORPHONE HCL 1 MG/ML IJ SOLN
0.5000 mg | INTRAMUSCULAR | Status: AC | PRN
  Administered 2019-09-11 (×4): 0.5 mg via INTRAVENOUS

## 2019-09-11 MED ORDER — TAMSULOSIN HCL 0.4 MG PO CAPS
0.4000 mg | ORAL_CAPSULE | Freq: Every day | ORAL | Status: DC
  Administered 2019-09-11 – 2019-09-21 (×9): 0.4 mg via ORAL
  Filled 2019-09-11 (×9): qty 1

## 2019-09-11 MED ORDER — FENTANYL CITRATE (PF) 250 MCG/5ML IJ SOLN
INTRAMUSCULAR | Status: DC | PRN
Start: 2019-09-11 — End: 2019-09-11
  Administered 2019-09-11: 100 ug via INTRAVENOUS

## 2019-09-11 MED ORDER — ONDANSETRON HCL 4 MG/2ML IJ SOLN: 4.0000 mg | Freq: Four times a day (QID) | INTRAMUSCULAR | Status: DC | PRN

## 2019-09-11 MED ORDER — FENTANYL CITRATE (PF) 250 MCG/5ML IJ SOLN
INTRAMUSCULAR | Status: AC
  Filled 2019-09-11: qty 5

## 2019-09-11 MED ORDER — ORAL CARE MOUTH RINSE: 15.0000 mL | Freq: Once | OROMUCOSAL | Status: AC

## 2019-09-11 MED ORDER — DEXAMETHASONE SODIUM PHOSPHATE 10 MG/ML IJ SOLN
INTRAMUSCULAR | Status: AC
  Filled 2019-09-11: qty 2

## 2019-09-11 MED ORDER — CEFAZOLIN SODIUM-DEXTROSE 2-4 GM/100ML-% IV SOLN
2.0000 g | Freq: Four times a day (QID) | INTRAVENOUS | Status: AC
  Administered 2019-09-11 – 2019-09-12 (×3): 2 g via INTRAVENOUS
  Filled 2019-09-11 (×3): qty 100

## 2019-09-11 MED ORDER — HYDROMORPHONE HCL 1 MG/ML IJ SOLN
INTRAMUSCULAR | Status: AC
  Filled 2019-09-11: qty 1

## 2019-09-11 MED ORDER — METOCLOPRAMIDE HCL 5 MG/ML IJ SOLN: 5.0000 mg | Freq: Three times a day (TID) | INTRAMUSCULAR | Status: DC | PRN

## 2019-09-11 MED ORDER — PROMETHAZINE HCL 25 MG/ML IJ SOLN: 6.2500 mg | INTRAMUSCULAR | Status: DC | PRN

## 2019-09-11 MED ORDER — IOHEXOL 300 MG/ML  SOLN
100.0000 mL | Freq: Once | INTRAMUSCULAR | Status: AC | PRN
  Administered 2019-09-11: 100 mL via INTRAVENOUS

## 2019-09-11 MED ORDER — FENTANYL CITRATE (PF) 100 MCG/2ML IJ SOLN
INTRAMUSCULAR | Status: AC
  Filled 2019-09-11: qty 2

## 2019-09-11 MED ORDER — PROPOFOL 10 MG/ML IV BOLUS
INTRAVENOUS | Status: DC | PRN
  Administered 2019-09-11: 200 mg via INTRAVENOUS

## 2019-09-11 MED ORDER — NALOXONE HCL 0.4 MG/ML IJ SOLN: 0.4000 mg | INTRAMUSCULAR | Status: DC | PRN

## 2019-09-11 MED ORDER — METOCLOPRAMIDE HCL 5 MG PO TABS: 5.0000 mg | ORAL_TABLET | Freq: Three times a day (TID) | ORAL | Status: DC | PRN

## 2019-09-11 MED ORDER — ENOXAPARIN SODIUM 40 MG/0.4ML ~~LOC~~ SOLN
40.0000 mg | SUBCUTANEOUS | Status: DC
  Administered 2019-09-12: 40 mg via SUBCUTANEOUS
  Filled 2019-09-11: qty 0.4

## 2019-09-11 MED ORDER — MIDAZOLAM HCL 2 MG/2ML IJ SOLN
INTRAMUSCULAR | Status: AC
  Filled 2019-09-11: qty 2

## 2019-09-11 MED ORDER — HYDROMORPHONE HCL 1 MG/ML IJ SOLN
INTRAMUSCULAR | Status: AC
  Filled 2019-09-11: qty 0.5

## 2019-09-11 MED ORDER — SUCCINYLCHOLINE CHLORIDE 200 MG/10ML IV SOSY
PREFILLED_SYRINGE | INTRAVENOUS | Status: AC
  Filled 2019-09-11: qty 10

## 2019-09-11 MED ORDER — FENTANYL CITRATE (PF) 100 MCG/2ML IJ SOLN
INTRAMUSCULAR | Status: AC
Start: 2019-09-11 — End: 2019-09-12
  Filled 2019-09-11: qty 2

## 2019-09-11 MED ORDER — FENTANYL CITRATE (PF) 100 MCG/2ML IJ SOLN
INTRAMUSCULAR | Status: AC
  Administered 2019-09-11: 100 ug
  Filled 2019-09-11: qty 2

## 2019-09-11 MED ORDER — ONDANSETRON HCL 4 MG PO TABS: 4.0000 mg | ORAL_TABLET | Freq: Four times a day (QID) | ORAL | Status: DC | PRN

## 2019-09-11 MED ORDER — DEXAMETHASONE SODIUM PHOSPHATE 10 MG/ML IJ SOLN
INTRAMUSCULAR | Status: DC | PRN
  Administered 2019-09-11: 10 mg via INTRAVENOUS

## 2019-09-11 MED ORDER — OXYCODONE HCL 5 MG PO TABS
10.0000 mg | ORAL_TABLET | ORAL | Status: DC | PRN
  Administered 2019-09-11 – 2019-09-13 (×4): 15 mg via ORAL
  Administered 2019-09-13: 10 mg via ORAL
  Administered 2019-09-14 – 2019-09-21 (×23): 15 mg via ORAL
  Administered 2019-09-21 (×2): 10 mg via ORAL
  Administered 2019-09-21 – 2019-09-22 (×4): 15 mg via ORAL
  Filled 2019-09-11 (×32): qty 3
  Filled 2019-09-11: qty 2

## 2019-09-11 MED ORDER — CEFAZOLIN SODIUM-DEXTROSE 2-4 GM/100ML-% IV SOLN
INTRAVENOUS | Status: AC
  Filled 2019-09-11: qty 100

## 2019-09-11 MED ORDER — HYDROMORPHONE HCL 1 MG/ML IJ SOLN
1.0000 mg | Freq: Once | INTRAMUSCULAR | Status: AC
  Administered 2019-09-11: 1 mg via INTRAVENOUS
  Filled 2019-09-11: qty 1

## 2019-09-11 MED ORDER — LIDOCAINE 2% (20 MG/ML) 5 ML SYRINGE
INTRAMUSCULAR | Status: AC
  Filled 2019-09-11: qty 5

## 2019-09-11 MED ORDER — HYDROMORPHONE HCL 1 MG/ML IJ SOLN
1.0000 mg | INTRAMUSCULAR | Status: DC | PRN
  Administered 2019-09-11 (×2): 1 mg via INTRAVENOUS
  Filled 2019-09-11 (×2): qty 1

## 2019-09-11 MED ORDER — LIDOCAINE 2% (20 MG/ML) 5 ML SYRINGE
INTRAMUSCULAR | Status: DC | PRN
  Administered 2019-09-11: 60 mg via INTRAVENOUS

## 2019-09-11 MED ORDER — DOCUSATE SODIUM 100 MG PO CAPS
100.0000 mg | ORAL_CAPSULE | Freq: Two times a day (BID) | ORAL | Status: DC
  Administered 2019-09-11 – 2019-09-16 (×8): 100 mg via ORAL
  Filled 2019-09-11 (×9): qty 1

## 2019-09-11 MED ORDER — METHOCARBAMOL 1000 MG/10ML IJ SOLN
500.0000 mg | Freq: Three times a day (TID) | INTRAVENOUS | Status: DC
  Filled 2019-09-11 (×33): qty 5

## 2019-09-11 MED ORDER — HYDROMORPHONE HCL 1 MG/ML IJ SOLN
INTRAMUSCULAR | Status: AC
Start: 2019-09-11 — End: ?
  Filled 2019-09-11: qty 0.5

## 2019-09-11 MED ORDER — PHENYLEPHRINE 40 MCG/ML (10ML) SYRINGE FOR IV PUSH (FOR BLOOD PRESSURE SUPPORT)
PREFILLED_SYRINGE | INTRAVENOUS | Status: AC
  Filled 2019-09-11: qty 10

## 2019-09-11 MED ORDER — METHOCARBAMOL 500 MG PO TABS
750.0000 mg | ORAL_TABLET | Freq: Three times a day (TID) | ORAL | Status: DC
  Administered 2019-09-11 – 2019-09-21 (×27): 750 mg via ORAL
  Filled 2019-09-11 (×27): qty 2

## 2019-09-11 MED ORDER — ROCURONIUM BROMIDE 10 MG/ML (PF) SYRINGE
PREFILLED_SYRINGE | INTRAVENOUS | Status: DC | PRN
  Administered 2019-09-11: 20 mg via INTRAVENOUS
  Administered 2019-09-11: 50 mg via INTRAVENOUS

## 2019-09-11 MED ORDER — FENTANYL CITRATE (PF) 100 MCG/2ML IJ SOLN
25.0000 ug | INTRAMUSCULAR | Status: DC | PRN
  Administered 2019-09-11 (×4): 50 ug via INTRAVENOUS

## 2019-09-11 MED ORDER — PROPOFOL 10 MG/ML IV BOLUS
INTRAVENOUS | Status: AC
  Filled 2019-09-11: qty 20

## 2019-09-11 MED ORDER — ROCURONIUM BROMIDE 10 MG/ML (PF) SYRINGE
PREFILLED_SYRINGE | INTRAVENOUS | Status: AC
  Filled 2019-09-11: qty 20

## 2019-09-11 MED ORDER — DEXMEDETOMIDINE HCL 200 MCG/2ML IV SOLN
INTRAVENOUS | Status: DC | PRN
  Administered 2019-09-11: 4 ug via INTRAVENOUS
  Administered 2019-09-11 (×2): 8 ug via INTRAVENOUS

## 2019-09-11 MED ORDER — POVIDONE-IODINE 10 % EX SWAB: 2.0000 "application " | Freq: Once | CUTANEOUS | Status: DC

## 2019-09-11 MED ORDER — METHOCARBAMOL 1000 MG/10ML IJ SOLN
1000.0000 mg | Freq: Once | INTRAMUSCULAR | Status: AC
  Administered 2019-09-11: 1000 mg via INTRAMUSCULAR
  Filled 2019-09-11 (×2): qty 10

## 2019-09-11 MED ORDER — OXYCODONE HCL 5 MG PO TABS
5.0000 mg | ORAL_TABLET | ORAL | Status: DC | PRN
  Administered 2019-09-12 – 2019-09-18 (×7): 10 mg via ORAL
  Filled 2019-09-11 (×7): qty 2
  Filled 2019-09-11: qty 1
  Filled 2019-09-11: qty 2

## 2019-09-11 MED ORDER — CEFAZOLIN SODIUM-DEXTROSE 2-4 GM/100ML-% IV SOLN
2.0000 g | INTRAVENOUS | Status: AC
  Administered 2019-09-11: 2 g via INTRAVENOUS

## 2019-09-11 MED ORDER — ONDANSETRON HCL 4 MG/2ML IJ SOLN
INTRAMUSCULAR | Status: DC | PRN
  Administered 2019-09-11: 4 mg via INTRAVENOUS

## 2019-09-11 MED ORDER — ACETAMINOPHEN 500 MG PO TABS
1000.0000 mg | ORAL_TABLET | Freq: Three times a day (TID) | ORAL | Status: DC
  Administered 2019-09-11 – 2019-09-22 (×32): 1000 mg via ORAL
  Filled 2019-09-11 (×33): qty 2

## 2019-09-11 MED ORDER — CHLORHEXIDINE GLUCONATE 0.12 % MT SOLN: 15.0000 mL | Freq: Once | OROMUCOSAL | Status: AC

## 2019-09-11 MED ORDER — METHADONE HCL 10 MG/ML PO CONC: 115.0000 mg | Freq: Every day | ORAL | Status: DC

## 2019-09-11 MED ORDER — CEFAZOLIN SODIUM-DEXTROSE 2-4 GM/100ML-% IV SOLN: 2.0000 g | Freq: Three times a day (TID) | INTRAVENOUS | Status: DC

## 2019-09-11 SURGICAL SUPPLY — 6 items
BNDG GAUZE ELAST 4 BULKY (GAUZE/BANDAGES/DRESSINGS) ×4 IMPLANT
K-WIRE .62 (WIRE) ×4 IMPLANT
PACK ORTHO EXTREMITY (CUSTOM PROCEDURE TRAY) ×4 IMPLANT
PIN TRACTION 5X50 (EXFIX) ×4 IMPLANT
SLING ARM FOAM STRAP XLG (SOFTGOODS) ×4 IMPLANT
TRAY FOLEY W/BAG SLVR 14FR (SET/KITS/TRAYS/PACK) ×4 IMPLANT

## 2019-09-11 NOTE — ED Notes (Signed)
Date and time results received: 09/11/19  (use smartphrase ".now" to insert current time)  Test: Lactic Acid  Critical Value: 3.5  Name of Provider Notified: Jeraldine Loots  Orders Received? Or Actions Taken?: No
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
## 2019-09-11 NOTE — Brief Op Note (Signed)
09/11/2019  10:18 PM  PATIENT:  James [name]  39 y.o. male  PRE-OPERATIVE DIAGNOSIS:  Left hip acetabular fracture dislocation  POST-OPERATIVE DIAGNOSIS:  Left hip acetabular fracture dislocation  PROCEDURE:  Procedure(s): 1. CLOSED REDUCTION HIP (Left) UNDER GENERAL ANESTHESIA 2. INSERTION OF TRACTION PIN (Left) TIBIA  SURGEON:  Surgeon(s) and Role:    Myrene Galas, MD - Primary  PHYSICIAN ASSISTANT: Montez Morita, PA-C  ANESTHESIA:   general  EBL:  NONE  BLOOD ADMINISTERED:none  DRAINS: none   LOCAL MEDICATIONS USED:  NONE  SPECIMEN:  No Specimen  DISPOSITION OF SPECIMEN:  N/A  COUNTS:  YES  TOURNIQUET:  * No tourniquets in log *  DICTATION: epic  PLAN OF CARE: Admit to inpatient   PATIENT DISPOSITION:  PACU - hemodynamically stable.   Delay start of Pharmacological VTE agent (>24hrs) due to surgical blood loss or risk of bleeding: no

## 2019-09-11 NOTE — ED Notes (Signed)
Pt repositioned several times, unable to get comfortable. Mother to bedside and updated on plan of care
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
## 2019-09-11 NOTE — Anesthesia Postprocedure Evaluation (Signed)
Anesthesia Post Note  Patient: James [name]  Procedure(s) Performed: CLOSED REDUCTION HIP (Left Hip) INSERTION OF TRACTION PIN (Left Leg Lower)     Patient location during evaluation: PACU Anesthesia Type: General Level of consciousness: awake and alert and awake Pain management: pain level controlled Vital Signs Assessment: post-procedure vital signs reviewed and stable Respiratory status: spontaneous breathing, nonlabored ventilation, respiratory function stable and patient connected to nasal cannula oxygen Cardiovascular status: blood pressure returned to baseline and stable Postop Assessment: no apparent nausea or vomiting Anesthetic complications: no   No complications documented.  Last Vitals:  Vitals:   09/11/19 1606 09/11/19 1628  BP: (!) 146/95 (!) 149/93  Pulse: 95 93  Resp: 19   Temp: 36.8 C 36.9 C  SpO2: 100%     Last Pain:  Vitals:   09/11/19 1628  TempSrc: Oral  PainSc:                  [name]

## 2019-09-11 NOTE — Progress Notes (Signed)
Patient admitted to room 18. Alert and oriented x4. Complain of pain and oxycodone 15 mg given. Ice applied. Patient has wallet at bedside but refused to turn in states theres no money and he will send home with his family.

## 2019-09-11 NOTE — Progress Notes (Signed)
Orthopedic Tech Progress Note Patient Details:  James [name] [birth year] [id number] OR RN called requesting for SKELETAL TRACTION SET UP for patient.  Ran into MD HANDY and he gave me a verbal order. Set up outside of OR DESK. Musculoskeletal Traction Type of Traction: Skeletal (Balanced Suspension) Traction Location: LLE Traction Weight: 30 lbs   Post Interventions Patient Tolerated: Other (comment) Instructions Provided: Other (comment)   Donald Pore 09/11/2019, 2:47 PM

## 2019-09-11 NOTE — Progress Notes (Signed)
Patient had xrays ordered in ED but not obtained prior to coming to short stay. Per Dr. Carola Frost, ok to not get xrays prior to surgery.

## 2019-09-11 NOTE — ED Notes (Signed)
Please call mother after surgery for any updates. She is with spouse in ED  [phone]

## 2019-09-11 NOTE — Transfer of Care (Signed)
Immediate Anesthesia Transfer of Care Note  Patient: James [name]  Procedure(s) Performed: CLOSED REDUCTION HIP (Left Hip) INSERTION OF TRACTION PIN (Left Leg Lower)  Patient Location: PACU  Anesthesia Type:General  Level of Consciousness: drowsy  Airway & Oxygen Therapy: Patient Spontanous Breathing and Patient connected to face mask oxygen  Post-op Assessment: Report given to RN and Post -op Vital signs reviewed and stable  Post vital signs: Reviewed and stable  Last Vitals:  Vitals Value Taken Time  BP 144/96 09/11/19 1449  Temp    Pulse 102 09/11/19 1451  Resp 22 09/11/19 1451  SpO2 100 % 09/11/19 1451  Vitals shown include unvalidated device data.  Last Pain:  Vitals:   09/11/19 1219  TempSrc:   PainSc: 7       Patients Stated Pain Goal: 4 (09/11/19 1219)  Complications: No complications documented.
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
## 2019-09-11 NOTE — Anesthesia Procedure Notes (Signed)
Procedure Name: Intubation Date/Time: 09/11/2019 1:41 PM Performed by: Alvera Novel, CRNA Pre-anesthesia Checklist: Patient identified, Emergency Drugs available, Suction available and Patient being monitored Patient Re-evaluated:Patient Re-evaluated prior to induction Oxygen Delivery Method: Circle System Utilized Preoxygenation: Pre-oxygenation with 100% oxygen Induction Type: Rapid sequence and IV induction Laryngoscope Size: 4 and Glidescope Grade View: Grade I Tube type: Oral Tube size: 7.0 mm Number of attempts: 1 Airway Equipment and Method: Stylet Placement Confirmation: ETT inserted through vocal cords under direct vision,  positive ETCO2 and breath sounds checked- equal and bilateral Secured at: 22 cm Tube secured with: Tape Dental Injury: Teeth and Oropharynx as per pre-operative assessment  Difficulty Due To: Difficult Airway- due to cervical collar

## 2019-09-12 ENCOUNTER — Inpatient Hospital Stay (HOSPITAL_COMMUNITY): Payer: No Typology Code available for payment source

## 2019-09-12 ENCOUNTER — Encounter (HOSPITAL_COMMUNITY): Payer: Self-pay | Admitting: Orthopedic Surgery

## 2019-09-12 DIAGNOSIS — F1120 Opioid dependence, uncomplicated: Secondary | ICD-10-CM

## 2019-09-12 DIAGNOSIS — B1920 Unspecified viral hepatitis C without hepatic coma: Secondary | ICD-10-CM

## 2019-09-12 DIAGNOSIS — S42202A Unspecified fracture of upper end of left humerus, initial encounter for closed fracture: Secondary | ICD-10-CM | POA: Diagnosis present

## 2019-09-12 HISTORY — DX: Unspecified fracture of upper end of left humerus, initial encounter for closed fracture: S42.202A

## 2019-09-12 HISTORY — DX: Unspecified viral hepatitis C without hepatic coma: B19.20

## 2019-09-12 HISTORY — DX: Opioid dependence, uncomplicated: F11.20

## 2019-09-12 LAB — RAPID URINE DRUG SCREEN, HOSP PERFORMED
Amphetamines: POSITIVE — AB
Barbiturates: NOT DETECTED
Benzodiazepines: POSITIVE — AB
Cocaine: NOT DETECTED
Opiates: POSITIVE — AB
Tetrahydrocannabinol: NOT DETECTED

## 2019-09-12 LAB — CBC
HCT: 36.5 % — ABNORMAL LOW (ref 39.0–52.0)
Hemoglobin: 12.1 g/dL — ABNORMAL LOW (ref 13.0–17.0)
MCH: 28.5 pg (ref 26.0–34.0)
MCHC: 33.2 g/dL (ref 30.0–36.0)
MCV: 85.9 fL (ref 80.0–100.0)
Platelets: 204 10*3/uL (ref 150–400)
RBC: 4.25 MIL/uL (ref 4.22–5.81)
RDW: 13.7 % (ref 11.5–15.5)
WBC: 9.7 10*3/uL (ref 4.0–10.5)
nRBC: 0 % (ref 0.0–0.2)

## 2019-09-12 LAB — COMPREHENSIVE METABOLIC PANEL
ALT: 20 U/L (ref 0–44)
AST: 39 U/L (ref 15–41)
Albumin: 3 g/dL — ABNORMAL LOW (ref 3.5–5.0)
Alkaline Phosphatase: 90 U/L (ref 38–126)
Anion gap: 7 (ref 5–15)
BUN: 11 mg/dL (ref 6–20)
CO2: 28 mmol/L (ref 22–32)
Calcium: 8.8 mg/dL — ABNORMAL LOW (ref 8.9–10.3)
Chloride: 103 mmol/L (ref 98–111)
Creatinine, Ser: 0.71 mg/dL (ref 0.61–1.24)
GFR calc Af Amer: >60 mL/min (ref >60)
GFR calc non Af Amer: >60 mL/min (ref >60)
Glucose, Bld: 116 mg/dL — ABNORMAL HIGH (ref 70–99)
Potassium: 4 mmol/L (ref 3.5–5.1)
Sodium: 138 mmol/L (ref 135–145)
Total Bilirubin: 0.5 mg/dL (ref 0.3–1.2)
Total Protein: 6.7 g/dL (ref 6.5–8.1)

## 2019-09-12 LAB — VITAMIN D 25 HYDROXY (VIT D DEFICIENCY, FRACTURES): Vit D, 25-Hydroxy: 27.47 ng/mL — ABNORMAL LOW (ref 30–100)

## 2019-09-12 LAB — LACTIC ACID, PLASMA: Lactic Acid, Venous: 1.5 mmol/L (ref 0.5–1.9)

## 2019-09-12 MED ORDER — CEFAZOLIN SODIUM-DEXTROSE 2-4 GM/100ML-% IV SOLN
2.0000 g | Freq: Once | INTRAVENOUS | Status: AC
  Administered 2019-09-13: 2 g via INTRAVENOUS
  Filled 2019-09-12: qty 100

## 2019-09-12 NOTE — Progress Notes (Signed)
Methadone 115mg  tubed from pharmacy, not available in pyxis. Wasted 5mg , 0.5 tablet with ., RN.

## 2019-09-12 NOTE — Progress Notes (Signed)
PT Cancellation Note  Patient Details Name: Jerin Franzel MRN: [id number] DOB: 1980-07-19   Cancelled Treatment:    Reason Eval/Treat Not Completed: Active bedrest order     Lillia Pauls, PT, DPT Acute Rehabilitation Services Pager [phone] Office [phone]    [name] 09/12/2019, 7:55 AM
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
## 2019-09-12 NOTE — Progress Notes (Addendum)
Orthopedic Tech Progress Note Patient Details:  James [name] [birth year] [id number] I Was asked to apply an ABDUCTION PILLOW between patient legs while in CT after removing the TRACTION. Once patient was finished with his scans, I removed pillow and reapplied traction. PER PA K. PAUL. Ortho Devices Type of Ortho Device: Abduction pillow Ortho Device/Splint Location: between legs Ortho Device/Splint Interventions: Other (comment)   Post Interventions Patient Tolerated: Well Instructions Provided: Care of device   Donald Pore 09/12/2019, 3:16 PM

## 2019-09-12 NOTE — Plan of Care (Signed)
  Problem: Pain Managment: Goal: General experience of comfort will improve Outcome: Progressing   

## 2019-09-12 NOTE — Progress Notes (Signed)
OT Cancellation Note  Patient Details Name: James [name] MRN: [id number] DOB: October 29, 1980   Cancelled Treatment:    Reason Eval/Treat Not Completed: Patient not medically ready (Pt still in traction. Per PA, pt will remain in traction until tomorrow or Fri pending progress.)  Dalphine Handing, MSOT, OTR/L Acute Rehabilitation Services [facility] Office Number: [phone] Pager: [phone]  Dalphine Handing 09/12/2019, 11:09 AM

## 2019-09-12 NOTE — Plan of Care (Signed)
  Problem: Health Behavior/Discharge Planning: Goal: Ability to manage health-related needs will improve Outcome: Progressing   Problem: Pain Managment: Goal: General experience of comfort will improve Outcome: Progressing   Problem: Safety: Goal: Ability to remain free from injury will improve Outcome: Progressing   Problem: Skin Integrity: Goal: Risk for impaired skin integrity will decrease Outcome: Progressing   

## 2019-09-13 ENCOUNTER — Encounter (HOSPITAL_COMMUNITY)
Admission: EM | Disposition: A | Discharge status: Rehabilitation Facility | Payer: Self-pay | Source: Home / Self Care | Attending: Orthopedic Surgery

## 2019-09-13 ENCOUNTER — Inpatient Hospital Stay (HOSPITAL_COMMUNITY): Payer: No Typology Code available for payment source | Admitting: Anesthesiology

## 2019-09-13 ENCOUNTER — Inpatient Hospital Stay (HOSPITAL_COMMUNITY): Payer: No Typology Code available for payment source

## 2019-09-13 HISTORY — PX: OPEN REDUCTION INTERNAL FIXATION ACETABULUM FRACTURE POSTERIOR: SHX6833

## 2019-09-13 LAB — HCV RNA QUANT RFLX ULTRA OR GENOTYP
HCV RNA Qnt(log copy/mL): UNDETERMINED log10 IU/mL
HepC Qn: NOT DETECTED IU/mL

## 2019-09-13 LAB — SURGICAL PCR SCREEN
MRSA, PCR: POSITIVE — AB
Staphylococcus aureus: POSITIVE — AB

## 2019-09-13 SURGERY — OPEN REDUCTION INTERNAL FIXATION ACETABULUM FRACTURE POSTERIOR
Anesthesia: General | Laterality: Left

## 2019-09-13 MED ORDER — ROCURONIUM BROMIDE 10 MG/ML (PF) SYRINGE
PREFILLED_SYRINGE | INTRAVENOUS | Status: AC
  Filled 2019-09-13: qty 10

## 2019-09-13 MED ORDER — MIDAZOLAM HCL 2 MG/2ML IJ SOLN
INTRAMUSCULAR | Status: DC | PRN
  Administered 2019-09-13: 2 mg via INTRAVENOUS

## 2019-09-13 MED ORDER — DEXAMETHASONE SODIUM PHOSPHATE 10 MG/ML IJ SOLN
INTRAMUSCULAR | Status: AC
  Filled 2019-09-13: qty 1

## 2019-09-13 MED ORDER — LIDOCAINE HCL (CARDIAC) PF 100 MG/5ML IV SOSY
PREFILLED_SYRINGE | INTRAVENOUS | Status: DC | PRN
  Administered 2019-09-13: 100 mg via INTRAVENOUS

## 2019-09-13 MED ORDER — METOCLOPRAMIDE HCL 5 MG PO TABS: 5.0000 mg | ORAL_TABLET | Freq: Three times a day (TID) | ORAL | Status: DC | PRN

## 2019-09-13 MED ORDER — ONDANSETRON HCL 4 MG PO TABS: 4.0000 mg | ORAL_TABLET | Freq: Four times a day (QID) | ORAL | Status: DC | PRN

## 2019-09-13 MED ORDER — KETAMINE HCL 10 MG/ML IJ SOLN
INTRAMUSCULAR | Status: DC | PRN
  Administered 2019-09-13 (×3): 10 mg via INTRAVENOUS
  Administered 2019-09-13: 20 mg via INTRAVENOUS

## 2019-09-13 MED ORDER — LACTATED RINGERS IV SOLN: INTRAVENOUS | Status: DC

## 2019-09-13 MED ORDER — HYDROMORPHONE HCL 1 MG/ML IJ SOLN
INTRAMUSCULAR | Status: AC
Start: 2019-09-13 — End: 2019-09-14
  Filled 2019-09-13: qty 1

## 2019-09-13 MED ORDER — FENTANYL CITRATE (PF) 100 MCG/2ML IJ SOLN
INTRAMUSCULAR | Status: DC | PRN
Start: 2019-09-13 — End: 2019-09-13
  Administered 2019-09-13: 100 ug via INTRAVENOUS
  Administered 2019-09-13 (×5): 50 ug via INTRAVENOUS
  Administered 2019-09-13: 100 ug via INTRAVENOUS

## 2019-09-13 MED ORDER — LACTATED RINGERS IV SOLN: INTRAVENOUS | Status: DC | PRN

## 2019-09-13 MED ORDER — MUPIROCIN 2 % EX OINT
1.0000 "application " | TOPICAL_OINTMENT | Freq: Two times a day (BID) | CUTANEOUS | Status: AC
  Administered 2019-09-13 – 2019-09-17 (×9): 1 via NASAL
  Filled 2019-09-13 (×2): qty 22

## 2019-09-13 MED ORDER — ACETAMINOPHEN 10 MG/ML IV SOLN
INTRAVENOUS | Status: AC
  Filled 2019-09-13: qty 100

## 2019-09-13 MED ORDER — ENOXAPARIN SODIUM 40 MG/0.4ML ~~LOC~~ SOLN
40.0000 mg | SUBCUTANEOUS | Status: DC
  Administered 2019-09-14 – 2019-09-18 (×4): 40 mg via SUBCUTANEOUS
  Filled 2019-09-13 (×4): qty 0.4

## 2019-09-13 MED ORDER — DEXMEDETOMIDINE (PRECEDEX) IN NS 20 MCG/5ML (4 MCG/ML) IV SYRINGE
PREFILLED_SYRINGE | INTRAVENOUS | Status: DC | PRN
  Administered 2019-09-13: 12 ug via INTRAVENOUS
  Administered 2019-09-13: 8 ug via INTRAVENOUS

## 2019-09-13 MED ORDER — ROCURONIUM BROMIDE 100 MG/10ML IV SOLN
INTRAVENOUS | Status: DC | PRN
  Administered 2019-09-13 (×2): 20 mg via INTRAVENOUS
  Administered 2019-09-13: 40 mg via INTRAVENOUS
  Administered 2019-09-13 (×2): 50 mg via INTRAVENOUS

## 2019-09-13 MED ORDER — PHENYLEPHRINE HCL-NACL 10-0.9 MG/250ML-% IV SOLN
INTRAVENOUS | Status: DC | PRN
  Administered 2019-09-13: 40 ug/min via INTRAVENOUS

## 2019-09-13 MED ORDER — KETAMINE HCL 50 MG/5ML IJ SOSY
PREFILLED_SYRINGE | INTRAMUSCULAR | Status: AC
  Filled 2019-09-13: qty 5

## 2019-09-13 MED ORDER — DOCUSATE SODIUM 100 MG PO CAPS: 100.0000 mg | ORAL_CAPSULE | Freq: Two times a day (BID) | ORAL | Status: DC

## 2019-09-13 MED ORDER — HYDROMORPHONE HCL 1 MG/ML IJ SOLN
INTRAMUSCULAR | Status: AC
  Filled 2019-09-13: qty 1

## 2019-09-13 MED ORDER — PHENYLEPHRINE 40 MCG/ML (10ML) SYRINGE FOR IV PUSH (FOR BLOOD PRESSURE SUPPORT)
PREFILLED_SYRINGE | INTRAVENOUS | Status: AC
  Filled 2019-09-13: qty 10

## 2019-09-13 MED ORDER — DEXMEDETOMIDINE (PRECEDEX) IN NS 20 MCG/5ML (4 MCG/ML) IV SYRINGE
PREFILLED_SYRINGE | INTRAVENOUS | Status: AC
  Filled 2019-09-13: qty 5

## 2019-09-13 MED ORDER — HYDROMORPHONE HCL 1 MG/ML IJ SOLN
INTRAMUSCULAR | Status: AC
  Filled 2019-09-13: qty 0.5

## 2019-09-13 MED ORDER — PROPOFOL 10 MG/ML IV BOLUS
INTRAVENOUS | Status: AC
  Filled 2019-09-13: qty 20

## 2019-09-13 MED ORDER — SUCCINYLCHOLINE CHLORIDE 200 MG/10ML IV SOSY
PREFILLED_SYRINGE | INTRAVENOUS | Status: AC
  Filled 2019-09-13: qty 10

## 2019-09-13 MED ORDER — HYDROMORPHONE HCL 1 MG/ML IJ SOLN
0.5000 mg | INTRAMUSCULAR | Status: AC | PRN
  Administered 2019-09-13 (×2): 0.5 mg via INTRAVENOUS

## 2019-09-13 MED ORDER — SUCCINYLCHOLINE CHLORIDE 20 MG/ML IJ SOLN
INTRAMUSCULAR | Status: DC | PRN
  Administered 2019-09-13: 120 mg via INTRAVENOUS

## 2019-09-13 MED ORDER — POTASSIUM CHLORIDE IN NACL 20-0.9 MEQ/L-% IV SOLN
INTRAVENOUS | Status: DC
  Filled 2019-09-13 (×5): qty 1000

## 2019-09-13 MED ORDER — CHLORHEXIDINE GLUCONATE CLOTH 2 % EX PADS
6.0000 | MEDICATED_PAD | Freq: Every day | CUTANEOUS | Status: AC
  Administered 2019-09-14 – 2019-09-17 (×4): 6 via TOPICAL

## 2019-09-13 MED ORDER — PROPOFOL 10 MG/ML IV BOLUS
INTRAVENOUS | Status: DC | PRN
  Administered 2019-09-13: 200 mg via INTRAVENOUS
  Administered 2019-09-13: 20 mg via INTRAVENOUS

## 2019-09-13 MED ORDER — HYDROMORPHONE HCL 1 MG/ML IJ SOLN
0.5000 mg | INTRAMUSCULAR | Status: DC | PRN
  Administered 2019-09-14 (×2): 1 mg via INTRAVENOUS
  Administered 2019-09-14: 0.5 mg via INTRAVENOUS
  Administered 2019-09-14 – 2019-09-16 (×7): 1 mg via INTRAVENOUS
  Filled 2019-09-13 (×4): qty 1
  Filled 2019-09-13: qty 0.5
  Filled 2019-09-13 (×5): qty 1

## 2019-09-13 MED ORDER — FENTANYL CITRATE (PF) 250 MCG/5ML IJ SOLN
INTRAMUSCULAR | Status: AC
  Filled 2019-09-13: qty 5

## 2019-09-13 MED ORDER — ONDANSETRON HCL 4 MG/2ML IJ SOLN: 4.0000 mg | Freq: Four times a day (QID) | INTRAMUSCULAR | Status: DC | PRN

## 2019-09-13 MED ORDER — HYDROMORPHONE HCL 1 MG/ML IJ SOLN
INTRAMUSCULAR | Status: DC | PRN
  Administered 2019-09-13: .5 mg via INTRAVENOUS

## 2019-09-13 MED ORDER — ACETAMINOPHEN 10 MG/ML IV SOLN
INTRAVENOUS | Status: DC | PRN
  Administered 2019-09-13: 1000 mg via INTRAVENOUS

## 2019-09-13 MED ORDER — DEXAMETHASONE SODIUM PHOSPHATE 10 MG/ML IJ SOLN
INTRAMUSCULAR | Status: DC | PRN
  Administered 2019-09-13: 10 mg via INTRAVENOUS

## 2019-09-13 MED ORDER — METOCLOPRAMIDE HCL 5 MG/ML IJ SOLN: 5.0000 mg | Freq: Three times a day (TID) | INTRAMUSCULAR | Status: DC | PRN

## 2019-09-13 MED ORDER — ONDANSETRON HCL 4 MG/2ML IJ SOLN
INTRAMUSCULAR | Status: DC | PRN
  Administered 2019-09-13: 4 mg via INTRAVENOUS

## 2019-09-13 MED ORDER — 0.9 % SODIUM CHLORIDE (POUR BTL) OPTIME
TOPICAL | Status: DC | PRN
  Administered 2019-09-13: 1000 mL

## 2019-09-13 MED ORDER — SODIUM CHLORIDE 0.9 % IR SOLN
Status: DC | PRN
  Administered 2019-09-13: 3000 mL

## 2019-09-13 MED ORDER — ONDANSETRON HCL 4 MG/2ML IJ SOLN
INTRAMUSCULAR | Status: AC
  Filled 2019-09-13: qty 2

## 2019-09-13 MED ORDER — HYDROMORPHONE HCL 1 MG/ML IJ SOLN
0.2500 mg | INTRAMUSCULAR | Status: DC | PRN
  Administered 2019-09-13 (×4): 0.5 mg via INTRAVENOUS

## 2019-09-13 MED ORDER — PHENYLEPHRINE HCL (PRESSORS) 10 MG/ML IV SOLN
INTRAVENOUS | Status: DC | PRN
  Administered 2019-09-13 (×2): 80 ug via INTRAVENOUS
  Administered 2019-09-13 (×2): 40 ug via INTRAVENOUS

## 2019-09-13 MED ORDER — MIDAZOLAM HCL 2 MG/2ML IJ SOLN
INTRAMUSCULAR | Status: AC
  Filled 2019-09-13: qty 2

## 2019-09-13 MED ORDER — CEFAZOLIN SODIUM-DEXTROSE 1-4 GM/50ML-% IV SOLN
1.0000 g | Freq: Four times a day (QID) | INTRAVENOUS | Status: AC
  Administered 2019-09-13 – 2019-09-14 (×3): 1 g via INTRAVENOUS
  Filled 2019-09-13 (×3): qty 50

## 2019-09-13 MED ORDER — CHLORHEXIDINE GLUCONATE 0.12 % MT SOLN
15.0000 mL | Freq: Once | OROMUCOSAL | Status: AC
  Administered 2019-09-13: 15 mL via OROMUCOSAL
  Filled 2019-09-13 (×2): qty 15

## 2019-09-13 MED ORDER — STERILE WATER FOR IRRIGATION IR SOLN
Status: DC | PRN
  Administered 2019-09-13: 2000 mL

## 2019-09-13 MED ORDER — SUGAMMADEX SODIUM 200 MG/2ML IV SOLN
INTRAVENOUS | Status: DC | PRN
  Administered 2019-09-13: 200 mg via INTRAVENOUS

## 2019-09-13 MED ORDER — PROMETHAZINE HCL 25 MG/ML IJ SOLN: 6.2500 mg | INTRAMUSCULAR | Status: DC | PRN

## 2019-09-13 SURGICAL SUPPLY — 83 items
APL SKNCLS STERI-STRIP NONHPOA (GAUZE/BANDAGES/DRESSINGS) ×2
BENZOIN TINCTURE PRP APPL 2/3 (GAUZE/BANDAGES/DRESSINGS) ×6 IMPLANT
BIT DRILL AO MATTA 2.5MX230M (BIT) ×1 IMPLANT
BNDG GAUZE ELAST 4 BULKY (GAUZE/BANDAGES/DRESSINGS) ×6 IMPLANT
BONE CHIP PRESERV 40CC PCAN1/2 (Bone Implant) ×3 IMPLANT
BRUSH SCRUB EZ PLAIN DRY (MISCELLANEOUS) ×12 IMPLANT
COVER SURGICAL LIGHT HANDLE (MISCELLANEOUS) ×6 IMPLANT
COVER WAND RF STERILE (DRAPES) ×3 IMPLANT
DRAPE C-ARM 42X72 X-RAY (DRAPES) ×3 IMPLANT
DRAPE C-ARMOR (DRAPES) ×3 IMPLANT
DRAPE IMP U-DRAPE 54X76 (DRAPES) ×3 IMPLANT
DRAPE INCISE IOBAN 66X45 STRL (DRAPES) IMPLANT
DRAPE ORTHO SPLIT 77X108 STRL (DRAPES) ×6
DRAPE SURG 17X11 SM STRL (DRAPES) ×6 IMPLANT
DRAPE SURG ORHT 6 SPLT 77X108 (DRAPES) ×2 IMPLANT
DRAPE U-SHAPE 47X51 STRL (DRAPES) ×6 IMPLANT
DRILL BIT AO MATTA 2.5MX230M (BIT) ×3
DRSG ADAPTIC 3X8 NADH LF (GAUZE/BANDAGES/DRESSINGS) ×3 IMPLANT
DRSG MEPILEX BORDER 4X12 (GAUZE/BANDAGES/DRESSINGS) ×6 IMPLANT
DRSG PAD ABDOMINAL 8X10 ST (GAUZE/BANDAGES/DRESSINGS) ×3 IMPLANT
ELECT REM PT RETURN 9FT ADLT (ELECTROSURGICAL) ×3
ELECTRODE REM PT RTRN 9FT ADLT (ELECTROSURGICAL) ×1 IMPLANT
EVACUATOR 1/8 PVC DRAIN (DRAIN) IMPLANT
GAUZE SPONGE 4X4 12PLY STRL (GAUZE/BANDAGES/DRESSINGS) ×6 IMPLANT
GLOVE BIO SURGEON STRL SZ7.5 (GLOVE) ×3 IMPLANT
GLOVE BIO SURGEON STRL SZ8 (GLOVE) ×3 IMPLANT
GLOVE BIOGEL PI IND STRL 7.5 (GLOVE) ×1 IMPLANT
GLOVE BIOGEL PI IND STRL 8 (GLOVE) ×1 IMPLANT
GLOVE BIOGEL PI INDICATOR 7.5 (GLOVE) ×2
GLOVE BIOGEL PI INDICATOR 8 (GLOVE) ×2
GOWN STRL REUS W/ TWL LRG LVL3 (GOWN DISPOSABLE) ×2 IMPLANT
GOWN STRL REUS W/ TWL XL LVL3 (GOWN DISPOSABLE) ×1 IMPLANT
GOWN STRL REUS W/TWL 2XL LVL3 (GOWN DISPOSABLE) ×12 IMPLANT
GOWN STRL REUS W/TWL LRG LVL3 (GOWN DISPOSABLE) ×6
GOWN STRL REUS W/TWL XL LVL3 (GOWN DISPOSABLE) ×3
KIT BASIN OR (CUSTOM PROCEDURE TRAY) ×3 IMPLANT
KIT TURNOVER KIT B (KITS) ×3 IMPLANT
LOOP VESSEL MAXI BLUE (MISCELLANEOUS) IMPLANT
MANIFOLD NEPTUNE II (INSTRUMENTS) ×3 IMPLANT
NS IRRIG 1000ML POUR BTL (IV SOLUTION) ×3 IMPLANT
PACK TOTAL JOINT (CUSTOM PROCEDURE TRAY) ×3 IMPLANT
PACK UNIVERSAL I (CUSTOM PROCEDURE TRAY) ×3 IMPLANT
PAD ARMBOARD 7.5X6 YLW CONV (MISCELLANEOUS) ×6 IMPLANT
PIN APEX 6X180MM EXFIX (EXFIX) ×3 IMPLANT
PLATE ACET STRT 70.5M 6H (Plate) ×3 IMPLANT
PLATE ACET STRT 94.5M 8H (Plate) ×6 IMPLANT
RETRACTOR ONETRAX LX 135X30 (MISCELLANEOUS) ×3 IMPLANT
RETRIEVER SUT HEWSON (MISCELLANEOUS) ×3 IMPLANT
SCREW CORTEX ST MATTA 3.5X12MM (Screw) ×6 IMPLANT
SCREW CORTEX ST MATTA 3.5X14 (Screw) ×3 IMPLANT
SCREW CORTEX ST MATTA 3.5X16MM (Screw) ×3 IMPLANT
SCREW CORTEX ST MATTA 3.5X18MM (Screw) ×3 IMPLANT
SCREW CORTEX ST MATTA 3.5X22MM (Screw) ×3 IMPLANT
SCREW CORTEX ST MATTA 3.5X24 (Screw) ×3 IMPLANT
SCREW CORTEX ST MATTA 3.5X26MM (Screw) ×3 IMPLANT
SCREW CORTEX ST MATTA 3.5X28MM (Screw) ×3 IMPLANT
SCREW CORTEX ST MATTA 3.5X30MM (Screw) ×6 IMPLANT
SCREW CORTEX ST MATTA 3.5X32MM (Screw) ×3 IMPLANT
SCREW CORTEX ST MATTA 3.5X36MM (Screw) ×3 IMPLANT
SCREW CORTEX ST MATTA 3.5X50MM (Screw) ×3 IMPLANT
SPONGE LAP 18X18 RF (DISPOSABLE) ×3 IMPLANT
STAPLER VISISTAT 35W (STAPLE) ×3 IMPLANT
STOCKINETTE IMPERVIOUS LG (DRAPES) ×3 IMPLANT
SUCTION FRAZIER HANDLE 10FR (MISCELLANEOUS) ×2
SUCTION TUBE FRAZIER 10FR DISP (MISCELLANEOUS) ×1 IMPLANT
SUT ETHIBOND 5 LR DA (SUTURE) ×3 IMPLANT
SUT ETHILON 2 0 PSLX (SUTURE) ×6 IMPLANT
SUT FIBERWIRE #2 38 T-5 BLUE (SUTURE) ×3
SUT PDS AB 2-0 CT1 27 (SUTURE) IMPLANT
SUT VIC AB 0 CT1 27 (SUTURE) ×12
SUT VIC AB 0 CT1 27XBRD ANBCTR (SUTURE) ×4 IMPLANT
SUT VIC AB 1 CT1 18XCR BRD 8 (SUTURE) ×1 IMPLANT
SUT VIC AB 1 CT1 8-18 (SUTURE) ×3
SUT VIC AB 2-0 CT1 27 (SUTURE) ×12
SUT VIC AB 2-0 CT1 TAPERPNT 27 (SUTURE) ×4 IMPLANT
SUT VIC AB 2-0 CT3 27 (SUTURE) IMPLANT
SUTURE FIBERWR #2 38 T-5 BLUE (SUTURE) ×1 IMPLANT
SYR 5ML LL (SYRINGE) IMPLANT
TOWEL GREEN STERILE (TOWEL DISPOSABLE) ×12 IMPLANT
TOWEL GREEN STERILE FF (TOWEL DISPOSABLE) ×3 IMPLANT
TRAY FOLEY MTR SLVR 16FR STAT (SET/KITS/TRAYS/PACK) IMPLANT
WATER STERILE IRR 1000ML POUR (IV SOLUTION) ×3 IMPLANT
YANKAUER SUCT BULB TIP NO VENT (SUCTIONS) IMPLANT

## 2019-09-13 NOTE — Plan of Care (Signed)
  Problem: Health Behavior/Discharge Planning: Goal: Ability to manage health-related needs will improve Outcome: Progressing   Problem: Pain Managment: Goal: General experience of comfort will improve Outcome: Progressing   Problem: Safety: Goal: Ability to remain free from injury will improve Outcome: Progressing   Problem: Skin Integrity: Goal: Risk for impaired skin integrity will decrease Outcome: Progressing   

## 2019-09-13 NOTE — Transfer of Care (Signed)
Immediate Anesthesia Transfer of Care Note  Patient: James [name]  Procedure(s) Performed: OPEN REDUCTION INTERNAL FIXATION ACETABULUM FRACTURE POSTERIOR (Left )  Patient Location: PACU  Anesthesia Type:General  Level of Consciousness: awake  Airway & Oxygen Therapy: Patient Spontanous Breathing  Post-op Assessment: Report given to RN and Post -op Vital signs reviewed and stable  Post vital signs: Reviewed and stable  Last Vitals:  Vitals Value Taken Time  BP 114/70 09/13/19 2109  Temp    Pulse 107 09/13/19 2113  Resp 18 09/13/19 2113  SpO2 95 % 09/13/19 2113  Vitals shown include unvalidated device data.  Last Pain:  Vitals:   09/13/19 1347  TempSrc: Oral  PainSc:       Patients Stated Pain Goal: 3 (09/13/19 0514)  Complications: No complications documented.

## 2019-09-13 NOTE — Anesthesia Postprocedure Evaluation (Signed)
Anesthesia Post Note  Patient: James [name]  Procedure(s) Performed: OPEN REDUCTION INTERNAL FIXATION ACETABULUM FRACTURE POSTERIOR (Left )     Patient location during evaluation: PACU Anesthesia Type: General Level of consciousness: awake and alert Pain management: pain level controlled Vital Signs Assessment: post-procedure vital signs reviewed and stable Respiratory status: spontaneous breathing, nonlabored ventilation, respiratory function stable and patient connected to nasal cannula oxygen Cardiovascular status: blood pressure returned to baseline and stable Postop Assessment: no apparent nausea or vomiting Anesthetic complications: no   No complications documented.  Last Vitals:  Vitals:   09/13/19 2223 09/13/19 2247  BP: 121/81 139/87  Pulse: 99 90  Resp: 15 18  Temp: 36.8 C 36.7 C  SpO2: 94% 96%    Last Pain:  Vitals:   09/13/19 2255  TempSrc:   PainSc: 7                  [name]

## 2019-09-13 NOTE — Plan of Care (Signed)
  Problem: Health Behavior/Discharge Planning: Goal: Ability to manage health-related needs will improve Outcome: Progressing   Problem: Clinical Measurements: Goal: Ability to maintain clinical measurements within normal limits will improve Outcome: Progressing Goal: Will remain free from infection Outcome: Progressing Goal: Diagnostic test results will improve Outcome: Progressing Goal: Respiratory complications will improve Outcome: Progressing Goal: Cardiovascular complication will be avoided Outcome: Progressing   Problem: Nutrition: Goal: Adequate nutrition will be maintained Outcome: Progressing   Problem: Elimination: Goal: Will not experience complications related to bowel motility Outcome: Progressing Goal: Will not experience complications related to urinary retention Outcome: Progressing   Problem: Pain Managment: Goal: General experience of comfort will improve Outcome: Progressing   Problem: Safety: Goal: Ability to remain free from injury will improve Outcome: Progressing   Problem: Skin Integrity: Goal: Risk for impaired skin integrity will decrease Outcome: Progressing

## 2019-09-13 NOTE — Anesthesia Preprocedure Evaluation (Addendum)
Anesthesia Evaluation  Patient identified by MRN, date of birth, ID band Patient awake    Reviewed: Allergy & Precautions, NPO status , Patient's Chart, lab work & pertinent test results  Airway Mallampati: II       Dental   Pulmonary neg pulmonary ROS,    breath sounds clear to auscultation       Cardiovascular hypertension,  Rhythm:Regular Rate:Normal     Neuro/Psych negative neurological ROS     GI/Hepatic negative GI ROS, (+) Hepatitis -  Endo/Other  negative endocrine ROS  Renal/GU negative Renal ROS     Musculoskeletal   Abdominal   Peds  Hematology   Anesthesia Other Findings   Reproductive/Obstetrics                           Anesthesia Physical Anesthesia Plan  ASA: II  Anesthesia Plan: General   Post-op Pain Management:    Induction: Intravenous  PONV Risk Score and Plan: 3 and Dexamethasone, Ondansetron and Midazolam  Airway Management Planned: Oral ETT  Additional Equipment:   Intra-op Plan:   Post-operative Plan:   Informed Consent: I have reviewed the patients History and Physical, chart, labs and discussed the procedure including the risks, benefits and alternatives for the proposed anesthesia with the patient or authorized representative who has indicated his/her understanding and acceptance.     Dental advisory given  Plan Discussed with: CRNA and Anesthesiologist  Anesthesia Plan Comments:        Anesthesia Quick Evaluation

## 2019-09-13 NOTE — TOC CAGE-AID Note (Addendum)
Transition of Care [facility]) - CAGE-AID Screening   Patient Details  Name: Luismiguel Lamere MRN: [id number] Date of Birth: 06-30-1980  Transition of Care [facility]) CM/SW Contact:    Emeterio Reeve, [place] Phone Number: 09/13/2019, 2:55 PM   Clinical Narrative:  CSW met with pt at bedside. CSW introduced self and explained her role at the hospital.  Pt denies substance use. Pt reports occasional amphetamine use. Pt reports he is in methadone treatment at [facility] in [place].   Pt stated that after discharge he plans to move in with his parents while he recovers and may need to transfer to a MAT facility in the [place] area. PT reports methadone has worked will for him the past 8 months he has been on it. Pt reports being on heroin for 10 years prior. Pt stated he did not need any resources because he plans to reutn to MAT.  CAGE-AID Screening:    Have You Ever Felt You Ought to Cut Down on Your Drinking or Drug Use?: Yes   Have You Felt Bad Or Guilty About Your Drinking Or Drug Use?: No Have You Ever Had a Drink or Used Drugs First Thing In The Morning to Steady Your Nerves or to Get Rid of a Hangover?: No    Substance Abuse Education Offered: Yes  Substance abuse interventions: Patient Counseling   Emeterio Reeve, Latanya Presser, [place] Social Worker [phone]

## 2019-09-13 NOTE — Anesthesia Procedure Notes (Signed)
Procedure Name: Intubation Date/Time: 09/13/2019 5:03 PM Performed by: Purvis Kilts, CRNA Pre-anesthesia Checklist: Patient identified, Emergency Drugs available, Suction available, Patient being monitored and Timeout performed Patient Re-evaluated:Patient Re-evaluated prior to induction Oxygen Delivery Method: Circle system utilized Preoxygenation: Pre-oxygenation with 100% oxygen Induction Type: IV induction Ventilation: Mask ventilation without difficulty Laryngoscope Size: Mac and 4 Grade View: Grade II Tube type: Oral Tube size: 7.5 mm Number of attempts: 1 Airway Equipment and Method: Stylet Placement Confirmation: ETT inserted through vocal cords under direct vision,  positive ETCO2 and breath sounds checked- equal and bilateral Secured at: 22 cm Tube secured with: Tape Dental Injury: Teeth and Oropharynx as per pre-operative assessment

## 2019-09-13 NOTE — Progress Notes (Signed)
PT Cancellation Note  Patient Details Name: James [name] MRN: [id number] DOB: [birth year]   Cancelled Treatment:    Reason Eval/Treat Not Completed: Patient at procedure or test/unavailable (OR).    Lillia Pauls, PT, DPT Acute Rehabilitation Services Pager [phone] Office [phone]    [name] 09/13/2019, 3:08 PM

## 2019-09-13 NOTE — Progress Notes (Signed)
OT Cancellation Note  Patient Details Name: James [name] MRN: [id number] DOB: [birth year]   Cancelled Treatment:    Reason Eval/Treat Not Completed: Active bedrest order (will await bed rest order to be d/c before initiating OT POC)  Dalphine Handing, MSOT, OTR/L Acute Rehabilitation Services [facility] Office Number: [phone] Pager: [phone]  Dalphine Handing 09/13/2019, 10:10 AM
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
## 2019-09-14 ENCOUNTER — Encounter (HOSPITAL_COMMUNITY): Payer: Self-pay | Admitting: Orthopedic Surgery

## 2019-09-14 DIAGNOSIS — E559 Vitamin D deficiency, unspecified: Secondary | ICD-10-CM

## 2019-09-14 HISTORY — DX: Vitamin D deficiency, unspecified: E55.9

## 2019-09-14 LAB — COMPREHENSIVE METABOLIC PANEL
ALT: 18 U/L (ref 0–44)
AST: 40 U/L (ref 15–41)
Albumin: 2.8 g/dL — ABNORMAL LOW (ref 3.5–5.0)
Alkaline Phosphatase: 75 U/L (ref 38–126)
Anion gap: 8 (ref 5–15)
BUN: 9 mg/dL (ref 6–20)
CO2: 26 mmol/L (ref 22–32)
Calcium: 8.6 mg/dL — ABNORMAL LOW (ref 8.9–10.3)
Chloride: 103 mmol/L (ref 98–111)
Creatinine, Ser: 0.61 mg/dL (ref 0.61–1.24)
GFR calc Af Amer: >60 mL/min (ref >60)
GFR calc non Af Amer: >60 mL/min (ref >60)
Glucose, Bld: 133 mg/dL — ABNORMAL HIGH (ref 70–99)
Potassium: 4.3 mmol/L (ref 3.5–5.1)
Sodium: 137 mmol/L (ref 135–145)
Total Bilirubin: 0.9 mg/dL (ref 0.3–1.2)
Total Protein: 6.3 g/dL — ABNORMAL LOW (ref 6.5–8.1)

## 2019-09-14 LAB — CBC
HCT: 32 % — ABNORMAL LOW (ref 39.0–52.0)
Hemoglobin: 10.5 g/dL — ABNORMAL LOW (ref 13.0–17.0)
MCH: 27.9 pg (ref 26.0–34.0)
MCHC: 32.8 g/dL (ref 30.0–36.0)
MCV: 84.9 fL (ref 80.0–100.0)
Platelets: 185 10*3/uL (ref 150–400)
RBC: 3.77 MIL/uL — ABNORMAL LOW (ref 4.22–5.81)
RDW: 13.5 % (ref 11.5–15.5)
WBC: 9.1 10*3/uL (ref 4.0–10.5)
nRBC: 0 % (ref 0.0–0.2)

## 2019-09-14 LAB — PROTIME-INR
INR: 1 (ref 0.8–1.2)
Prothrombin Time: 13.2 seconds (ref 11.4–15.2)

## 2019-09-14 MED ORDER — GABAPENTIN 600 MG PO TABS
300.0000 mg | ORAL_TABLET | Freq: Two times a day (BID) | ORAL | Status: DC
  Administered 2019-09-14 (×2): 300 mg via ORAL
  Filled 2019-09-14 (×3): qty 0.5

## 2019-09-14 MED ORDER — KETOROLAC TROMETHAMINE 30 MG/ML IJ SOLN
30.0000 mg | Freq: Three times a day (TID) | INTRAMUSCULAR | Status: AC
  Administered 2019-09-14 – 2019-09-18 (×11): 30 mg via INTRAVENOUS
  Filled 2019-09-14 (×11): qty 1
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
## 2019-09-14 NOTE — Anesthesia Preprocedure Evaluation (Addendum)
Anesthesia Evaluation  Patient identified by MRN, date of birth, ID band Patient awake    Reviewed: Allergy & Precautions, NPO status , Patient's Chart, lab work & pertinent test results  Airway Mallampati: II  TM Distance: >3 FB Neck ROM: Full    Dental  (+) Dental Advisory Given   Pulmonary neg pulmonary ROS,    breath sounds clear to auscultation       Cardiovascular hypertension,  Rhythm:Regular Rate:Normal     Neuro/Psych negative neurological ROS  negative psych ROS   GI/Hepatic (+)     substance abuse  , Hepatitis -, C  Endo/Other    Renal/GU      Musculoskeletal  (+) narcotic dependent  Abdominal   Peds  Hematology   Anesthesia Other Findings   Reproductive/Obstetrics                            Anesthesia Physical Anesthesia Plan  ASA: II  Anesthesia Plan: General   Post-op Pain Management:  Regional for Post-op pain   Induction: Intravenous  PONV Risk Score and Plan: 3 and Ondansetron, Dexamethasone and Midazolam  Airway Management Planned: Oral ETT  Additional Equipment: None  Intra-op Plan:   Post-operative Plan: Extubation in OR  Informed Consent: I have reviewed the patients History and Physical, chart, labs and discussed the procedure including the risks, benefits and alternatives for the proposed anesthesia with the patient or authorized representative who has indicated his/her understanding and acceptance.     Dental advisory given  Plan Discussed with: CRNA  Anesthesia Plan Comments:        Anesthesia Quick Evaluation

## 2019-09-14 NOTE — Plan of Care (Signed)
°  Problem: Clinical Measurements: Goal: Ability to maintain clinical measurements within normal limits will improve Outcome: Progressing Goal: Will remain free from infection Outcome: Progressing Goal: Diagnostic test results will improve Outcome: Progressing Goal: Respiratory complications will improve Outcome: Progressing
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
## 2019-09-15 MED ORDER — GABAPENTIN 300 MG PO CAPS
300.0000 mg | ORAL_CAPSULE | Freq: Two times a day (BID) | ORAL | Status: DC
  Administered 2019-09-15 – 2019-09-22 (×14): 300 mg via ORAL
  Filled 2019-09-15 (×15): qty 1

## 2019-09-15 MED ORDER — CEFAZOLIN SODIUM-DEXTROSE 2-4 GM/100ML-% IV SOLN
2.0000 g | INTRAVENOUS | Status: AC
  Administered 2019-09-16: 2 g via INTRAVENOUS
  Filled 2019-09-15: qty 100
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
## 2019-09-15 NOTE — Plan of Care (Signed)
  Problem: Health Behavior/Discharge Planning: Goal: Ability to manage health-related needs will improve Outcome: Progressing   Problem: Clinical Measurements: Goal: Ability to maintain clinical measurements within normal limits will improve Outcome: Progressing Goal: Will remain free from infection Outcome: Progressing Goal: Diagnostic test results will improve Outcome: Progressing Goal: Respiratory complications will improve Outcome: Progressing Goal: Cardiovascular complication will be avoided Outcome: Progressing   Problem: Nutrition: Goal: Adequate nutrition will be maintained Outcome: Progressing   Problem: Coping: Goal: Level of anxiety will decrease Outcome: Progressing   Problem: Pain Managment: Goal: General experience of comfort will improve Outcome: Progressing   Problem: Safety: Goal: Ability to remain free from injury will improve Outcome: Progressing   Problem: Skin Integrity: Goal: Risk for impaired skin integrity will decrease Outcome: Progressing

## 2019-09-15 NOTE — Progress Notes (Signed)
Inpatient Rehab Admissions Coordinator Note:   Per PT/OT recommendations, pt was screened for CIR candidacy by Wolfgang Phoenix, MS, CCC-SLP.  At this time we are not recommending an Inpatient Rehab consult.  Will re-screen after pt has L humerus fx and therapies re-evaluate.  Please contact me with questions.    Wolfgang Phoenix, MS, CCC-SLP Admissions Coordinator [phone] 09/15/19 2:18 PM
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
## 2019-09-16 ENCOUNTER — Encounter (HOSPITAL_COMMUNITY)
Admission: EM | Disposition: A | Discharge status: Rehabilitation Facility | Payer: Self-pay | Source: Home / Self Care | Attending: Orthopedic Surgery

## 2019-09-16 ENCOUNTER — Inpatient Hospital Stay (HOSPITAL_COMMUNITY): Payer: No Typology Code available for payment source | Admitting: Anesthesiology

## 2019-09-16 ENCOUNTER — Inpatient Hospital Stay (HOSPITAL_COMMUNITY): Payer: No Typology Code available for payment source

## 2019-09-16 ENCOUNTER — Encounter (HOSPITAL_COMMUNITY): Payer: Self-pay | Admitting: Orthopedic Surgery

## 2019-09-16 HISTORY — PX: ORIF HUMERUS FRACTURE: SHX2126

## 2019-09-16 LAB — TYPE AND SCREEN
ABO/RH(D): O NEG
Antibody Screen: NEGATIVE

## 2019-09-16 LAB — POCT I-STAT, CHEM 8
BUN: 10 mg/dL (ref 6–20)
Calcium, Ion: 1.19 mmol/L (ref 1.15–1.40)
Chloride: 102 mmol/L (ref 98–111)
Creatinine, Ser: 0.4 mg/dL — ABNORMAL LOW (ref 0.61–1.24)
Glucose, Bld: 117 mg/dL — ABNORMAL HIGH (ref 70–99)
HCT: 22 % — ABNORMAL LOW (ref 39.0–52.0)
Hemoglobin: 7.5 g/dL — ABNORMAL LOW (ref 13.0–17.0)
Potassium: 4.9 mmol/L (ref 3.5–5.1)
Sodium: 137 mmol/L (ref 135–145)
TCO2: 26 mmol/L (ref 22–32)

## 2019-09-16 LAB — CBC
HCT: 25.4 % — ABNORMAL LOW (ref 39.0–52.0)
Hemoglobin: 8.1 g/dL — ABNORMAL LOW (ref 13.0–17.0)
MCH: 27.7 pg (ref 26.0–34.0)
MCHC: 31.9 g/dL (ref 30.0–36.0)
MCV: 87 fL (ref 80.0–100.0)
Platelets: 174 10*3/uL (ref 150–400)
RBC: 2.92 MIL/uL — ABNORMAL LOW (ref 4.22–5.81)
RDW: 13.7 % (ref 11.5–15.5)
WBC: 7.8 10*3/uL (ref 4.0–10.5)
nRBC: 0 % (ref 0.0–0.2)

## 2019-09-16 LAB — ABO/RH: ABO/RH(D): O NEG

## 2019-09-16 SURGERY — OPEN REDUCTION INTERNAL FIXATION (ORIF) PROXIMAL HUMERUS FRACTURE
Anesthesia: General | Site: Arm Upper | Laterality: Left

## 2019-09-16 MED ORDER — FENTANYL CITRATE (PF) 250 MCG/5ML IJ SOLN
INTRAMUSCULAR | Status: AC
  Filled 2019-09-16: qty 5

## 2019-09-16 MED ORDER — METOCLOPRAMIDE HCL 5 MG/ML IJ SOLN: 5.0000 mg | Freq: Three times a day (TID) | INTRAMUSCULAR | Status: DC | PRN

## 2019-09-16 MED ORDER — ACETAMINOPHEN 10 MG/ML IV SOLN
INTRAVENOUS | Status: DC | PRN
  Administered 2019-09-16: 1000 mg via INTRAVENOUS

## 2019-09-16 MED ORDER — PROPOFOL 10 MG/ML IV BOLUS
INTRAVENOUS | Status: DC | PRN
  Administered 2019-09-16: 200 mg via INTRAVENOUS

## 2019-09-16 MED ORDER — MIDAZOLAM HCL 2 MG/2ML IJ SOLN
INTRAMUSCULAR | Status: AC
  Administered 2019-09-16: 2 mg via INTRAVENOUS
  Filled 2019-09-16: qty 2

## 2019-09-16 MED ORDER — FENTANYL CITRATE (PF) 100 MCG/2ML IJ SOLN: 50.0000 ug | Freq: Once | INTRAMUSCULAR | Status: AC

## 2019-09-16 MED ORDER — LACTATED RINGERS IV SOLN: INTRAVENOUS | Status: DC

## 2019-09-16 MED ORDER — ROCURONIUM BROMIDE 10 MG/ML (PF) SYRINGE
PREFILLED_SYRINGE | INTRAVENOUS | Status: DC | PRN
  Administered 2019-09-16: 10 mg via INTRAVENOUS
  Administered 2019-09-16 (×2): 20 mg via INTRAVENOUS
  Administered 2019-09-16 (×2): 50 mg via INTRAVENOUS

## 2019-09-16 MED ORDER — BUPIVACAINE LIPOSOME 1.3 % IJ SUSP
INTRAMUSCULAR | Status: DC | PRN
  Administered 2019-09-16: 10 mL via PERINEURAL

## 2019-09-16 MED ORDER — ONDANSETRON HCL 4 MG PO TABS: 4.0000 mg | ORAL_TABLET | Freq: Four times a day (QID) | ORAL | Status: DC | PRN

## 2019-09-16 MED ORDER — MIDAZOLAM HCL 2 MG/2ML IJ SOLN
INTRAMUSCULAR | Status: DC | PRN
  Administered 2019-09-16: 2 mg via INTRAVENOUS

## 2019-09-16 MED ORDER — DEXAMETHASONE SODIUM PHOSPHATE 10 MG/ML IJ SOLN
INTRAMUSCULAR | Status: DC | PRN
  Administered 2019-09-16: 10 mg via INTRAVENOUS

## 2019-09-16 MED ORDER — MIDAZOLAM HCL 2 MG/2ML IJ SOLN
INTRAMUSCULAR | Status: AC
  Filled 2019-09-16: qty 2

## 2019-09-16 MED ORDER — MENTHOL 3 MG MT LOZG: 1.0000 | LOZENGE | OROMUCOSAL | Status: DC | PRN

## 2019-09-16 MED ORDER — FENTANYL CITRATE (PF) 100 MCG/2ML IJ SOLN
INTRAMUSCULAR | Status: AC
  Administered 2019-09-16: 50 ug via INTRAVENOUS
  Filled 2019-09-16: qty 2

## 2019-09-16 MED ORDER — ALBUMIN HUMAN 5 % IV SOLN: INTRAVENOUS | Status: DC | PRN

## 2019-09-16 MED ORDER — PROPOFOL 10 MG/ML IV BOLUS
INTRAVENOUS | Status: AC
  Filled 2019-09-16: qty 20

## 2019-09-16 MED ORDER — MIDAZOLAM HCL 2 MG/2ML IJ SOLN: 2.0000 mg | Freq: Once | INTRAMUSCULAR | Status: AC

## 2019-09-16 MED ORDER — AMISULPRIDE (ANTIEMETIC) 5 MG/2ML IV SOLN: 10.0000 mg | Freq: Once | INTRAVENOUS | Status: DC | PRN

## 2019-09-16 MED ORDER — LIDOCAINE 2% (20 MG/ML) 5 ML SYRINGE
INTRAMUSCULAR | Status: DC | PRN
  Administered 2019-09-16: 20 mg via INTRAVENOUS

## 2019-09-16 MED ORDER — PHENOL 1.4 % MT LIQD: 1.0000 | OROMUCOSAL | Status: DC | PRN

## 2019-09-16 MED ORDER — CHLORHEXIDINE GLUCONATE 0.12 % MT SOLN: 15.0000 mL | Freq: Once | OROMUCOSAL | Status: AC

## 2019-09-16 MED ORDER — FENTANYL CITRATE (PF) 250 MCG/5ML IJ SOLN
INTRAMUSCULAR | Status: DC | PRN
Start: 2019-09-16 — End: 2019-09-16
  Administered 2019-09-16: 25 ug via INTRAVENOUS
  Administered 2019-09-16 (×2): 50 ug via INTRAVENOUS
  Administered 2019-09-16 (×3): 25 ug via INTRAVENOUS
  Administered 2019-09-16: 50 ug via INTRAVENOUS

## 2019-09-16 MED ORDER — METOCLOPRAMIDE HCL 5 MG PO TABS: 5.0000 mg | ORAL_TABLET | Freq: Three times a day (TID) | ORAL | Status: DC | PRN

## 2019-09-16 MED ORDER — 0.9 % SODIUM CHLORIDE (POUR BTL) OPTIME
TOPICAL | Status: DC | PRN
  Administered 2019-09-16: 1000 mL

## 2019-09-16 MED ORDER — ONDANSETRON HCL 4 MG/2ML IJ SOLN
INTRAMUSCULAR | Status: DC | PRN
  Administered 2019-09-16: 4 mg via INTRAVENOUS

## 2019-09-16 MED ORDER — FENTANYL CITRATE (PF) 100 MCG/2ML IJ SOLN
25.0000 ug | INTRAMUSCULAR | Status: DC | PRN
  Administered 2019-09-16: 50 ug via INTRAVENOUS

## 2019-09-16 MED ORDER — OXYCODONE HCL 5 MG PO TABS
ORAL_TABLET | ORAL | Status: AC
  Filled 2019-09-16: qty 2

## 2019-09-16 MED ORDER — CEFAZOLIN SODIUM-DEXTROSE 2-4 GM/100ML-% IV SOLN
INTRAVENOUS | Status: AC
  Filled 2019-09-16: qty 100

## 2019-09-16 MED ORDER — KETAMINE HCL 50 MG/5ML IJ SOSY
PREFILLED_SYRINGE | INTRAMUSCULAR | Status: AC
  Filled 2019-09-16: qty 5

## 2019-09-16 MED ORDER — SUGAMMADEX SODIUM 200 MG/2ML IV SOLN
INTRAVENOUS | Status: DC | PRN
  Administered 2019-09-16: 200 mg via INTRAVENOUS

## 2019-09-16 MED ORDER — ORAL CARE MOUTH RINSE
15.0000 mL | Freq: Once | OROMUCOSAL | Status: AC
  Administered 2019-09-16: 15 mL via OROMUCOSAL

## 2019-09-16 MED ORDER — CEFAZOLIN SODIUM-DEXTROSE 2-4 GM/100ML-% IV SOLN
2.0000 g | Freq: Four times a day (QID) | INTRAVENOUS | Status: AC
  Administered 2019-09-16 – 2019-09-17 (×3): 2 g via INTRAVENOUS
  Filled 2019-09-16 (×3): qty 100

## 2019-09-16 MED ORDER — BUPIVACAINE-EPINEPHRINE (PF) 0.5% -1:200000 IJ SOLN
INTRAMUSCULAR | Status: DC | PRN
  Administered 2019-09-16: 15 mL via PERINEURAL

## 2019-09-16 MED ORDER — ONDANSETRON HCL 4 MG/2ML IJ SOLN: 4.0000 mg | Freq: Four times a day (QID) | INTRAMUSCULAR | Status: DC | PRN

## 2019-09-16 MED ORDER — DOCUSATE SODIUM 100 MG PO CAPS
100.0000 mg | ORAL_CAPSULE | Freq: Two times a day (BID) | ORAL | Status: DC
  Administered 2019-09-16 – 2019-09-22 (×12): 100 mg via ORAL
  Filled 2019-09-16 (×12): qty 1

## 2019-09-16 MED ORDER — ACETAMINOPHEN 10 MG/ML IV SOLN
INTRAVENOUS | Status: AC
  Filled 2019-09-16: qty 100

## 2019-09-16 MED ORDER — CHLORHEXIDINE GLUCONATE 0.12 % MT SOLN
OROMUCOSAL | Status: AC
  Filled 2019-09-16: qty 15

## 2019-09-16 SURGICAL SUPPLY — 77 items
APL SKNCLS STERI-STRIP NONHPOA (GAUZE/BANDAGES/DRESSINGS)
BENZOIN TINCTURE PRP APPL 2/3 (GAUZE/BANDAGES/DRESSINGS) IMPLANT
BIT DRILL 3.2 (BIT) ×2
BIT DRILL 3.2XCALB NS DISP (BIT) ×1 IMPLANT
BIT DRILL CALIBRATED 2.7 (BIT) ×2 IMPLANT
BIT DRL 3.2XCALB NS DISP (BIT) ×1
BNDG GAUZE ELAST 4 BULKY (GAUZE/BANDAGES/DRESSINGS) IMPLANT
BRUSH SCRUB EZ  4% CHG (MISCELLANEOUS) ×1
BRUSH SCRUB EZ 4% CHG (MISCELLANEOUS) ×1 IMPLANT
BRUSH SCRUB EZ PLAIN DRY (MISCELLANEOUS) ×2 IMPLANT
COVER SURGICAL LIGHT HANDLE (MISCELLANEOUS) ×4 IMPLANT
COVER WAND RF STERILE (DRAPES) ×2 IMPLANT
DRAPE C-ARM 42X72 X-RAY (DRAPES) ×2 IMPLANT
DRAPE IMP U-DRAPE 54X76 (DRAPES) ×2 IMPLANT
DRAPE INCISE IOBAN 66X45 STRL (DRAPES) IMPLANT
DRAPE ORTHO SPLIT 77X108 STRL (DRAPES) ×4
DRAPE SURG 17X11 SM STRL (DRAPES) ×4 IMPLANT
DRAPE SURG ORHT 6 SPLT 77X108 (DRAPES) ×2 IMPLANT
DRAPE U-SHAPE 47X51 STRL (DRAPES) ×4 IMPLANT
DRSG ADAPTIC 3X8 NADH LF (GAUZE/BANDAGES/DRESSINGS) IMPLANT
DRSG MEPILEX BORDER 4X12 (GAUZE/BANDAGES/DRESSINGS) ×2 IMPLANT
DRSG PAD ABDOMINAL 8X10 ST (GAUZE/BANDAGES/DRESSINGS) IMPLANT
ELECT REM PT RETURN 9FT ADLT (ELECTROSURGICAL) ×2
ELECTRODE REM PT RTRN 9FT ADLT (ELECTROSURGICAL) ×1 IMPLANT
EVACUATOR 1/8 PVC DRAIN (DRAIN) IMPLANT
GAUZE SPONGE 4X4 12PLY STRL (GAUZE/BANDAGES/DRESSINGS) IMPLANT
GLOVE BIO SURGEON STRL SZ7.5 (GLOVE) ×4 IMPLANT
GLOVE BIO SURGEON STRL SZ8 (GLOVE) ×4 IMPLANT
GLOVE BIOGEL PI IND STRL 7.5 (GLOVE) ×1 IMPLANT
GLOVE BIOGEL PI IND STRL 8 (GLOVE) ×3 IMPLANT
GLOVE BIOGEL PI INDICATOR 7.5 (GLOVE) ×1
GLOVE BIOGEL PI INDICATOR 8 (GLOVE) ×3
GOWN STRL REUS W/ TWL LRG LVL3 (GOWN DISPOSABLE) ×3 IMPLANT
GOWN STRL REUS W/ TWL XL LVL3 (GOWN DISPOSABLE) ×1 IMPLANT
GOWN STRL REUS W/TWL 2XL LVL3 (GOWN DISPOSABLE) IMPLANT
GOWN STRL REUS W/TWL LRG LVL3 (GOWN DISPOSABLE) ×6
GOWN STRL REUS W/TWL XL LVL3 (GOWN DISPOSABLE) ×2
K-WIRE 2X5 SS THRDED S3 (WIRE) ×6
KIT BASIN OR (CUSTOM PROCEDURE TRAY) ×2 IMPLANT
KIT TURNOVER KIT B (KITS) ×2 IMPLANT
KWIRE 2X5 SS THRDED S3 (WIRE) ×3 IMPLANT
LOOP VESSEL MAXI BLUE (MISCELLANEOUS) IMPLANT
MANIFOLD NEPTUNE II (INSTRUMENTS) ×2 IMPLANT
NEEDLE 1/2 CIR MAYO (NEEDLE) ×2 IMPLANT
NS IRRIG 1000ML POUR BTL (IV SOLUTION) ×2 IMPLANT
PACK TOTAL JOINT (CUSTOM PROCEDURE TRAY) ×2 IMPLANT
PACK UNIVERSAL I (CUSTOM PROCEDURE TRAY) ×2 IMPLANT
PAD ARMBOARD 7.5X6 YLW CONV (MISCELLANEOUS) ×4 IMPLANT
PEG LOCKING 3.2X 28MM (Peg) ×2 IMPLANT
PEG LOCKING 3.2X34 (Screw) ×2 IMPLANT
PEG LOCKING 3.2X36 (Screw) ×2 IMPLANT
PEG LOCKING 3.2X40 (Peg) ×2 IMPLANT
PEG LOCKING 3.2X42 (Screw) ×2 IMPLANT
PEG LOCKING 3.2X52 (Peg) ×4 IMPLANT
PLATE PROX HUMERUS HI LT 4H (Plate) ×2 IMPLANT
SCREW LOCK CORT STAR 3.5X28 (Screw) ×2 IMPLANT
SCREW LOCK CORT STAR 3.5X30 (Screw) ×2 IMPLANT
SCREW LOW PROF TIS 3.5X28MM (Screw) ×2 IMPLANT
SCREW LOW PROFILE 3.5X30MM TIS (Screw) ×2 IMPLANT
SCREW LP NL T15 3.5X20 (Screw) ×2 IMPLANT
SPONGE LAP 18X18 RF (DISPOSABLE) ×2 IMPLANT
STAPLER VISISTAT 35W (STAPLE) ×2 IMPLANT
STOCKINETTE IMPERVIOUS LG (DRAPES) ×2 IMPLANT
SUCTION FRAZIER HANDLE 10FR (MISCELLANEOUS) ×1
SUCTION TUBE FRAZIER 10FR DISP (MISCELLANEOUS) ×1 IMPLANT
SUT ETHILON 2 0 FS 18 (SUTURE) ×6 IMPLANT
SUT FIBERWIRE #2 38 T-5 BLUE (SUTURE) ×10
SUT VIC AB 0 CT1 27 (SUTURE) ×2
SUT VIC AB 0 CT1 27XBRD ANBCTR (SUTURE) ×1 IMPLANT
SUT VIC AB 2-0 CT1 27 (SUTURE) ×2
SUT VIC AB 2-0 CT1 TAPERPNT 27 (SUTURE) ×1 IMPLANT
SUTURE FIBERWR #2 38 T-5 BLUE (SUTURE) ×5 IMPLANT
TOWEL GREEN STERILE (TOWEL DISPOSABLE) ×2 IMPLANT
TOWEL GREEN STERILE FF (TOWEL DISPOSABLE) ×2 IMPLANT
TUBE SUCT ARGYLE STRL (TUBING) ×2 IMPLANT
WATER STERILE IRR 1000ML POUR (IV SOLUTION) ×2 IMPLANT
YANKAUER SUCT BULB TIP NO VENT (SUCTIONS) ×2 IMPLANT

## 2019-09-16 NOTE — Brief Op Note (Signed)
09/16/2019  5:32 PM  PATIENT:  James [name]  39 y.o. male  PRE-OPERATIVE DIAGNOSIS:  1. FOUR PART PROXIMAL HUMERUS FRACTURE  POST-OPERATIVE DIAGNOSIS:  1. FOUR PART PROXIMAL HUMERUS FRACTURE 2. COMPLETE ROTATOR CUFF TEAR LEFT PROXIMAL HUMERUS  PROCEDURE:  Procedure(s): 1. OPEN REDUCTION INTERNAL FIXATION (ORIF) PROXIMAL HUMERUS FRACTURE (Left), FOUR PART, INCLUDING TUBEROSITIES 2. REPAIR OF RIGHT ROTATOR CUFF AVULSION  SURGEON:  Surgeon(s) and Role:    Myrene Galas, MD - Primary  PHYSICIAN ASSISTANT: Montez Morita, PA-C  ANESTHESIA:   general  EBL:  300 mL   BLOOD ADMINISTERED:none  DRAINS: none   LOCAL MEDICATIONS USED:  NONE  SPECIMEN:  No Specimen  DISPOSITION OF SPECIMEN:  N/A  COUNTS:  YES  TOURNIQUET:  * No tourniquets in log *  DICTATION: .Note written in EPIC  PLAN OF CARE: Admit to inpatient   PATIENT DISPOSITION:  PACU - hemodynamically stable.   Delay start of Pharmacological VTE agent (>24hrs) due to surgical blood loss or risk of bleeding: no
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
## 2019-09-16 NOTE — Progress Notes (Signed)
Patient transported to OR via bed by pt transport.

## 2019-09-16 NOTE — Anesthesia Procedure Notes (Signed)
Procedure Name: Intubation Date/Time: 09/16/2019 1:10 PM Performed by: Clearnce Sorrel, CRNA Pre-anesthesia Checklist: Patient identified, Emergency Drugs available, Suction available, Patient being monitored and Timeout performed Patient Re-evaluated:Patient Re-evaluated prior to induction Oxygen Delivery Method: Circle system utilized Preoxygenation: Pre-oxygenation with 100% oxygen Induction Type: IV induction Ventilation: Mask ventilation without difficulty Laryngoscope Size: Mac and 4 Grade View: Grade I Tube type: Oral Tube size: 7.5 mm Number of attempts: 1 Airway Equipment and Method: Stylet Placement Confirmation: ETT inserted through vocal cords under direct vision,  positive ETCO2 and breath sounds checked- equal and bilateral Secured at: 23 cm Tube secured with: Tape Dental Injury: Teeth and Oropharynx as per pre-operative assessment

## 2019-09-16 NOTE — Anesthesia Procedure Notes (Signed)
Anesthesia Regional Block: Interscalene brachial plexus block   Pre-Anesthetic Checklist: ,, timeout performed, Correct Patient, Correct Site, Correct Laterality, Correct Procedure, Correct Position, site marked, Risks and benefits discussed,  Surgical consent,  Pre-op evaluation,  At surgeon's request and post-op pain management  Laterality: Left  Prep: chloraprep       Needles:  Injection technique: Single-shot  Needle Type: Echogenic Stimulator Needle     Needle Length: 9cm  Needle Gauge: 21     Additional Needles:   Procedures:, nerve stimulator,,, ultrasound used (permanent image in chart),,,,   Nerve Stimulator or Paresthesia:  Response: deltoid and bicep, 0.5 mA,   Additional Responses:   Narrative:  Start time: 09/16/2019 12:45 PM End time: 09/16/2019 12:50 PM Injection made incrementally with aspirations every 5 mL.  Performed by: Personally  Anesthesiologist: Marcene Duos, MD

## 2019-09-16 NOTE — Progress Notes (Signed)
Spoke with MDA about pt hgb 7.5. No need to transfuse at this time will continue to monitor.

## 2019-09-16 NOTE — Transfer of Care (Signed)
Immediate Anesthesia Transfer of Care Note  Patient: James [name]  Procedure(s) Performed: OPEN REDUCTION INTERNAL FIXATION (ORIF) PROXIMAL HUMERUS FRACTURE (Left Arm Upper)  Patient Location: PACU  Anesthesia Type:General  Level of Consciousness: awake, oriented and drowsy  Airway & Oxygen Therapy: Patient Spontanous Breathing  Post-op Assessment: Report given to RN and Post -op Vital signs reviewed and stable  Post vital signs: Reviewed and stable  Last Vitals:  Vitals Value Taken Time  BP 117/89 09/16/19 1707  Temp 37.1 C 09/16/19 1705  Pulse 104 09/16/19 1710  Resp 19 09/16/19 1710  SpO2 94 % 09/16/19 1710  Vitals shown include unvalidated device data.  Last Pain:  Vitals:   09/16/19 1050  TempSrc:   PainSc: 8       Patients Stated Pain Goal: 3 (09/15/19 0450)  Complications: No complications documented.

## 2019-09-17 ENCOUNTER — Ambulatory Visit
Admit: 2019-09-17 | Discharge: 2019-09-17 | Disposition: A | Discharge status: Home / Self Care | Payer: No Typology Code available for payment source | Attending: Radiation Oncology | Admitting: Radiation Oncology

## 2019-09-17 ENCOUNTER — Encounter (HOSPITAL_COMMUNITY): Payer: Self-pay | Admitting: Orthopedic Surgery

## 2019-09-17 ENCOUNTER — Ambulatory Visit: Admit: 2019-09-17 | Payer: Medicaid Other | Admitting: Radiation Oncology

## 2019-09-17 ENCOUNTER — Other Ambulatory Visit: Payer: Self-pay

## 2019-09-17 NOTE — Plan of Care (Signed)
  Problem: Health Behavior/Discharge Planning: Goal: Ability to manage health-related needs will improve Outcome: Progressing   Problem: Clinical Measurements: Goal: Will remain free from infection Outcome: Progressing   Problem: Elimination: Goal: Will not experience complications related to bowel motility Outcome: Progressing   Problem: Pain Managment: Goal: General experience of comfort will improve Outcome: Progressing

## 2019-09-17 NOTE — Progress Notes (Signed)
Pt left facility via carelink to [facility] for radiation oncology.

## 2019-09-17 NOTE — Anesthesia Postprocedure Evaluation (Signed)
Anesthesia Post Note  Patient: James [name]  Procedure(s) Performed: OPEN REDUCTION INTERNAL FIXATION (ORIF) PROXIMAL HUMERUS FRACTURE (Left Arm Upper)     Patient location during evaluation: PACU Anesthesia Type: General Level of consciousness: awake and alert Pain management: pain level controlled Vital Signs Assessment: post-procedure vital signs reviewed and stable Respiratory status: spontaneous breathing, nonlabored ventilation, respiratory function stable and patient connected to nasal cannula oxygen Cardiovascular status: blood pressure returned to baseline and stable Postop Assessment: no apparent nausea or vomiting Anesthetic complications: no   No complications documented.  Last Vitals:  Vitals:   09/16/19 1845 09/16/19 2150  BP: 137/86 115/71  Pulse: 90 88  Resp: 16 18  Temp: 37.2 C 36.9 C  SpO2: 99% 94%    Last Pain:  Vitals:   09/16/19 2325  TempSrc:   PainSc: 8                  Kennieth Rad
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
## 2019-09-17 NOTE — Progress Notes (Signed)
Pt back from [facility] via [facility]. , remains alert/orienrted in no apparent distress. And will D/C foley cath as ordered.

## 2019-09-18 ENCOUNTER — Institutional Professional Consult (permissible substitution): Payer: Medicaid Other | Admitting: Radiation Oncology

## 2019-09-18 ENCOUNTER — Encounter (HOSPITAL_COMMUNITY): Payer: Self-pay | Admitting: Orthopedic Surgery

## 2019-09-18 DIAGNOSIS — R3911 Hesitancy of micturition: Secondary | ICD-10-CM

## 2019-09-18 HISTORY — DX: Hesitancy of micturition: R39.11

## 2019-09-18 LAB — CBC
HCT: 21.9 % — ABNORMAL LOW (ref 39.0–52.0)
Hemoglobin: 7.1 g/dL — ABNORMAL LOW (ref 13.0–17.0)
MCH: 28.9 pg (ref 26.0–34.0)
MCHC: 32.4 g/dL (ref 30.0–36.0)
MCV: 89 fL (ref 80.0–100.0)
Platelets: 218 10*3/uL (ref 150–400)
RBC: 2.46 MIL/uL — ABNORMAL LOW (ref 4.22–5.81)
RDW: 14.6 % (ref 11.5–15.5)
WBC: 8.2 10*3/uL (ref 4.0–10.5)
nRBC: 0 % (ref 0.0–0.2)

## 2019-09-18 LAB — HEMOGLOBIN AND HEMATOCRIT, BLOOD
HCT: 22.7 % — ABNORMAL LOW (ref 39.0–52.0)
Hemoglobin: 7.3 g/dL — ABNORMAL LOW (ref 13.0–17.0)

## 2019-09-18 MED ORDER — HYDROMORPHONE HCL 1 MG/ML IJ SOLN: 0.5000 mg | Freq: Two times a day (BID) | INTRAMUSCULAR | Status: DC | PRN

## 2019-09-18 MED ORDER — APIXABAN 2.5 MG PO TABS: 2.5000 mg | ORAL_TABLET | Freq: Two times a day (BID) | ORAL | Status: DC

## 2019-09-18 MED ORDER — APIXABAN 2.5 MG PO TABS
2.5000 mg | ORAL_TABLET | Freq: Two times a day (BID) | ORAL | Status: DC
  Administered 2019-09-19 – 2019-09-22 (×7): 2.5 mg via ORAL
  Filled 2019-09-18 (×7): qty 1
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
## 2019-09-18 NOTE — Progress Notes (Signed)
Inpatient Rehab Admissions Coordinator:   Consult received.  Went to meet with pt and he was working with therapies.  Will f/u with him tomorrow for CIR discussion.   Estill Dooms, PT, DPT Admissions Coordinator [phone] 09/18/19  3:59 PM

## 2019-09-18 NOTE — NC FL2 (Signed)
[place] MEDICAID FL2 LEVEL OF CARE SCREENING TOOL     IDENTIFICATION  Patient Name: James [name] Birthdate: 04-21-1980 Sex: male Admission Date (Current Location): 09/11/2019  [facility] and [place] Number:  Producer, television/film/video and Address:  [place]. [facility], [address] [place], [place] [address]      Provider Number: [id number]  Attending Physician Name and Address:  Myrene Galas, MD  Relative Name and Phone Number:  Nikolay Demetriou - step-mother - [phone]    Current Level of Care: Hospital Recommended Level of Care: Skilled Nursing Facility Prior Approval Number:    Date Approved/Denied:   PASRR Number: [id number]  Discharge Plan: SNF    Current Diagnoses: Patient Active Problem List   Diagnosis Date Noted  . Urinary hesitancy 09/18/2019  . Vitamin D insufficiency 09/14/2019  . Hepatitis C 09/12/2019  . 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 Closed fracture of left proximal humerus 09/12/2019  . Acetabulum fracture, left (HCC) 09/11/2019  . Low serum vitamin B12 04/09/2018  . Obesity 04/09/2018  . IVDU (intravenous drug user) 03/14/2018  . Essential hypertension 03/14/2018  . 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    Orientation RESPIRATION BLADDER Height & Weight     Self, Time, Situation, Place  Normal Continent Weight: 113.4 kg Height:  5\' 9"  (175.3 cm)  BEHAVIORAL SYMPTOMS/MOOD NEUROLOGICAL BOWEL NUTRITION STATUS      Continent Diet (See discharge summary)  AMBULATORY STATUS COMMUNICATION OF NEEDS Skin   Extensive Assist Verbally Surgical wounds                       Personal Care Assistance Level of Assistance  Bathing, Feeding, Dressing Bathing Assistance: Limited assistance Feeding assistance: Independent Dressing Assistance: Limited assistance     Functional Limitations Info  Sight, Hearing, Speech Sight Info: Adequate Hearing Info: Adequate Speech Info: Adequate    SPECIAL CARE FACTORS  FREQUENCY  PT (By licensed PT), OT (By licensed OT)     PT Frequency: 5 x per week OT Frequency: 5 x per week            Contractures Contractures Info: Not present    Additional Factors Info  Code Status, Allergies, Psychotropic Code Status Info: Full code Allergies Info: NKDA Psychotropic Info: Neurontin         Current Medications (09/18/2019):  This is the current hospital active medication list Current Facility-Administered Medications  Medication Dose Route Frequency Provider Last Rate Last Admin  . acetaminophen (TYLENOL) tablet 1,000 mg  1,000 mg Oral Q8H 09/20/2019, PA-C   1,000 mg at 09/18/19 0700  . [START ON 09/19/2019] apixaban (ELIQUIS) tablet 2.5 mg  2.5 mg Oral BID 09/21/2019, [facility]      . Chlorhexidine Gluconate Cloth 2 % PADS 6 each  6 each Topical Q0600 [facility], PA-C   6 each at 09/17/19 [phone]  . docusate sodium (COLACE) capsule 100 mg  100 mg Oral BID 9735, PA-C   100 mg at 09/18/19 1038  . gabapentin (NEURONTIN) capsule 300 mg  300 mg Oral BID 09/20/19, PA-C   300 mg at 09/18/19 1038  . HYDROmorphone (DILAUDID) injection 0.5-1 mg  0.5-1 mg Intravenous Q12H PRN 09/20/19, PA-C      . ketorolac (TORADOL) 30 MG/ML injection 30 mg  30 mg Intravenous Q8H Montez Morita, PA-C   30 mg at 09/18/19 0700  . lactated ringers infusion   Intravenous Continuous 09/20/19, PA-C 10 mL/hr  at 09/16/19 1259 New Bag at 09/16/19 1545  . lactated ringers infusion   Intravenous Continuous Marcene Duos, MD 10 mL/hr at 09/16/19 2031 New Bag at 09/16/19 2031  . menthol-cetylpyridinium (CEPACOL) lozenge 3 mg  1 lozenge Oral PRN Montez Morita, PA-C       Or  . phenol (CHLORASEPTIC) mouth spray 1 spray  1 spray Mouth/Throat PRN Montez Morita, PA-C      . methadone (DOLOPHINE) tablet 115 mg  115 mg Oral Daily Montez Morita, PA-C   115 mg at 09/18/19 1039  . methocarbamol (ROBAXIN) tablet 750 mg  750 mg Oral TID Montez Morita, PA-C   750 mg at 09/18/19 0700   Or  .  methocarbamol (ROBAXIN) 500 mg in dextrose 5 % 50 mL IVPB  500 mg Intravenous TID Montez Morita, PA-C      . metoCLOPramide (REGLAN) tablet 5-10 mg  5-10 mg Oral Q8H PRN Montez Morita, PA-C       Or  . metoCLOPramide (REGLAN) injection 5-10 mg  5-10 mg Intravenous Q8H PRN Montez Morita, PA-C      . naloxone [facility]) injection 0.4 mg  0.4 mg Intravenous PRN Montez Morita, PA-C      . ondansetron [facility]) tablet 4 mg  4 mg Oral Q6H PRN Montez Morita, PA-C       Or  . ondansetron [facility]) injection 4 mg  4 mg Intravenous Q6H PRN Montez Morita, PA-C      . oxyCODONE (Oxy IR/ROXICODONE) immediate release tablet 10-15 mg  10-15 mg Oral Q4H PRN Montez Morita, PA-C   15 mg at 09/18/19 0251  . oxyCODONE (Oxy IR/ROXICODONE) immediate release tablet 5-10 mg  5-10 mg Oral Q4H PRN Montez Morita, PA-C   10 mg at 09/18/19 0700  . tamsulosin (FLOMAX) capsule 0.4 mg  0.4 mg Oral QPC supper Montez Morita, PA-C   0.4 mg at 09/17/19 1824     Discharge Medications: Please see discharge summary for a list of discharge medications.  Relevant Imaging Results:  Relevant Lab Results:   Additional Information SS# [id number]  Janae Bridgeman, RN
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
## 2019-09-18 NOTE — Plan of Care (Signed)
  Problem: Health Behavior/Discharge Planning: Goal: Ability to manage health-related needs will improve Outcome: Progressing   Problem: Clinical Measurements: Goal: Ability to maintain clinical measurements within normal limits will improve Outcome: Progressing   Problem: Elimination: Goal: Will not experience complications related to bowel motility Outcome: Progressing Goal: Will not experience complications related to urinary retention Outcome: Progressing   Problem: Pain Managment: Goal: General experience of comfort will improve Outcome: Progressing   
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
## 2019-09-19 LAB — CBC
HCT: 23.4 % — ABNORMAL LOW (ref 39.0–52.0)
Hemoglobin: 7.5 g/dL — ABNORMAL LOW (ref 13.0–17.0)
MCH: 28.3 pg (ref 26.0–34.0)
MCHC: 32.1 g/dL (ref 30.0–36.0)
MCV: 88.3 fL (ref 80.0–100.0)
Platelets: 240 10*3/uL (ref 150–400)
RBC: 2.65 MIL/uL — ABNORMAL LOW (ref 4.22–5.81)
RDW: 14.6 % (ref 11.5–15.5)
WBC: 7.9 10*3/uL (ref 4.0–10.5)
nRBC: 0 % (ref 0.0–0.2)
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
## 2019-09-19 NOTE — Progress Notes (Signed)
OT Cancellation Note  Patient Details Name: James [name] MRN: [id number] DOB: [birth year]   Cancelled Treatment:    Reason Eval/Treat Not Completed: Fatigue/lethargy limiting ability to participate. Pt sleeping soundly in bed, unable to be aroused. Will re-attempt OT session as appropriate.  Lorre Munroe 09/19/2019, 1:33 PM

## 2019-09-19 NOTE — Plan of Care (Signed)
  Problem: Health Behavior/Discharge Planning: Goal: Ability to manage health-related needs will improve Outcome: Progressing   Problem: Clinical Measurements: Goal: Ability to maintain clinical measurements within normal limits will improve Outcome: Progressing Goal: Will remain free from infection Outcome: Progressing   Problem: Elimination: Goal: Will not experience complications related to bowel motility Outcome: Progressing   Problem: Pain Managment: Goal: General experience of comfort will improve Outcome: Progressing
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
## 2019-09-20 MED ORDER — MELOXICAM 7.5 MG PO TABS
15.0000 mg | ORAL_TABLET | Freq: Every day | ORAL | Status: DC
  Administered 2019-09-21 – 2019-09-22 (×2): 15 mg via ORAL
  Filled 2019-09-20 (×3): qty 2
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
## 2019-09-20 NOTE — TOC Progression Note (Signed)
Transition of Care [facility]) - Progression Note    Patient Details  Name: James [name] MRN: [id number] Date of Birth: 01-Mar-1980  [facility] CM/SW Contact  Janae Bridgeman, RN Phone Number: 09/20/2019, 3:24 PM  Clinical Narrative:    Case management reached out to Roddie Mc, CM with [facility] to see if they would be willing to accept the patient for admission under an LOG from [facility]  Will continue to follow the patient for possible admission opportunity for rehab for this patient.   Expected Discharge Plan: IP Rehab Facility Barriers to Discharge: Continued Medical Work up (probable Left humerus surgery on 09/15/2019)  Expected Discharge Plan and Services Expected Discharge Plan: IP Rehab Facility   Discharge Planning Services: CM Consult      Social Determinants of Health (SDOH) Interventions    Readmission Risk Interventions Readmission Risk Prevention Plan 09/14/2019  Post Dischage Appt Complete  Medication Screening Complete  Transportation Screening Complete

## 2019-09-20 NOTE — Plan of Care (Signed)
  Problem: Health Behavior/Discharge Planning: Goal: Ability to manage health-related needs will improve Outcome: Progressing   Problem: Clinical Measurements: Goal: Ability to maintain clinical measurements within normal limits will improve Outcome: Progressing Goal: Will remain free from infection Outcome: Progressing Goal: Diagnostic test results will improve Outcome: Progressing Goal: Respiratory complications will improve Outcome: Progressing Goal: Cardiovascular complication will be avoided Outcome: Progressing   Problem: Coping: Goal: Level of anxiety will decrease Outcome: Progressing   Problem: Elimination: Goal: Will not experience complications related to bowel motility Outcome: Progressing Goal: Will not experience complications related to urinary retention Outcome: Progressing   Problem: Pain Managment: Goal: General experience of comfort will improve Outcome: Progressing   Problem: Safety: Goal: Ability to remain free from injury will improve Outcome: Progressing

## 2019-09-20 NOTE — Plan of Care (Signed)
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
## 2019-09-20 NOTE — Progress Notes (Signed)
Inpatient Rehab Admissions Coordinator:   Met with pt at the bedside.  I have no beds available for this patient to admit to CIR today.  Will continue to follow for timing of potential admission pending bed availability.   Shann Medal, PT, DPT Admissions Coordinator [phone] 09/20/19  3:26 PM
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
## 2019-09-21 MED ORDER — MAGNESIUM CITRATE PO SOLN
0.5000 | Freq: Once | ORAL | Status: AC | PRN
  Administered 2019-09-22: 1 via ORAL
  Filled 2019-09-21: qty 296

## 2019-09-21 MED ORDER — METHOCARBAMOL 1000 MG/10ML IJ SOLN
500.0000 mg | Freq: Four times a day (QID) | INTRAVENOUS | Status: DC
  Filled 2019-09-21 (×7): qty 5

## 2019-09-21 MED ORDER — METHOCARBAMOL 500 MG PO TABS
1000.0000 mg | ORAL_TABLET | Freq: Four times a day (QID) | ORAL | Status: DC
  Administered 2019-09-21 – 2019-09-22 (×5): 1000 mg via ORAL
  Filled 2019-09-21 (×5): qty 2
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
## 2019-09-21 NOTE — Plan of Care (Signed)
  Problem: Health Behavior/Discharge Planning: Goal: Ability to manage health-related needs will improve Outcome: Progressing   

## 2019-09-21 NOTE — Plan of Care (Signed)

## 2019-09-21 NOTE — Progress Notes (Signed)
Inpatient Rehabilitation Admissions Coordinator  CIR bed is available for patient admit on Saturday. I met with patient at bedside and he is in agreement. I have contacted Ainsley Spinner , PA, acute team and TOC. Dr. Letta Pate will round in am for final approval. 5 N RN can call rehab charge nurse at 12 noon at [phone] to make arrangements to admit Saturday when bed is available.  Danne Baxter, RN, MSN Rehab Admissions Coordinator [phone] 09/21/2019 3:16 PM

## 2019-09-22 ENCOUNTER — Encounter (HOSPITAL_COMMUNITY): Payer: Self-pay | Admitting: Physical Medicine and Rehabilitation

## 2019-09-22 ENCOUNTER — Other Ambulatory Visit: Payer: Self-pay

## 2019-09-22 ENCOUNTER — Inpatient Hospital Stay (HOSPITAL_COMMUNITY)
Admission: RE | Admit: 2019-09-22 | Discharge: 2019-10-01 | DRG: 560 | Disposition: A | Discharge status: Home / Self Care | Payer: No Typology Code available for payment source | Source: Intra-hospital | Attending: Physical Medicine and Rehabilitation | Admitting: Physical Medicine and Rehabilitation

## 2019-09-22 DIAGNOSIS — T07XXXA Unspecified multiple injuries, initial encounter: Secondary | ICD-10-CM | POA: Diagnosis present

## 2019-09-22 DIAGNOSIS — T8131XA Disruption of external operation (surgical) wound, not elsewhere classified, initial encounter: Secondary | ICD-10-CM | POA: Diagnosis not present

## 2019-09-22 DIAGNOSIS — N319 Neuromuscular dysfunction of bladder, unspecified: Secondary | ICD-10-CM | POA: Diagnosis present

## 2019-09-22 DIAGNOSIS — T40605A Adverse effect of unspecified narcotics, initial encounter: Secondary | ICD-10-CM | POA: Diagnosis present

## 2019-09-22 DIAGNOSIS — K5903 Drug induced constipation: Secondary | ICD-10-CM | POA: Diagnosis present

## 2019-09-22 DIAGNOSIS — G8918 Other acute postprocedural pain: Secondary | ICD-10-CM | POA: Diagnosis not present

## 2019-09-22 DIAGNOSIS — R9431 Abnormal electrocardiogram [ECG] [EKG]: Secondary | ICD-10-CM | POA: Diagnosis not present

## 2019-09-22 DIAGNOSIS — S32402A Unspecified fracture of left acetabulum, initial encounter for closed fracture: Secondary | ICD-10-CM

## 2019-09-22 DIAGNOSIS — B1920 Unspecified viral hepatitis C without hepatic coma: Secondary | ICD-10-CM | POA: Diagnosis present

## 2019-09-22 DIAGNOSIS — S32402D Unspecified fracture of left acetabulum, subsequent encounter for fracture with routine healing: Secondary | ICD-10-CM | POA: Diagnosis present

## 2019-09-22 DIAGNOSIS — I10 Essential (primary) hypertension: Secondary | ICD-10-CM | POA: Diagnosis present

## 2019-09-22 DIAGNOSIS — S42292D Other displaced fracture of upper end of left humerus, subsequent encounter for fracture with routine healing: Secondary | ICD-10-CM | POA: Diagnosis not present

## 2019-09-22 DIAGNOSIS — D62 Acute posthemorrhagic anemia: Secondary | ICD-10-CM | POA: Diagnosis present

## 2019-09-22 DIAGNOSIS — F1121 Opioid dependence, in remission: Secondary | ICD-10-CM | POA: Diagnosis present

## 2019-09-22 DIAGNOSIS — S42202A Unspecified fracture of upper end of left humerus, initial encounter for closed fracture: Secondary | ICD-10-CM | POA: Diagnosis present

## 2019-09-22 DIAGNOSIS — F1120 Opioid dependence, uncomplicated: Secondary | ICD-10-CM | POA: Diagnosis present

## 2019-09-22 DIAGNOSIS — E559 Vitamin D deficiency, unspecified: Secondary | ICD-10-CM | POA: Diagnosis present

## 2019-09-22 LAB — CBC
HCT: 25.5 % — ABNORMAL LOW (ref 39.0–52.0)
Hemoglobin: 7.9 g/dL — ABNORMAL LOW (ref 13.0–17.0)
MCH: 27.6 pg (ref 26.0–34.0)
MCHC: 31 g/dL (ref 30.0–36.0)
MCV: 89.2 fL (ref 80.0–100.0)
Platelets: 290 10*3/uL (ref 150–400)
RBC: 2.86 MIL/uL — ABNORMAL LOW (ref 4.22–5.81)
RDW: 14.9 % (ref 11.5–15.5)
WBC: 6.4 10*3/uL (ref 4.0–10.5)
nRBC: 0 % (ref 0.0–0.2)

## 2019-09-22 MED ORDER — TRAZODONE HCL 50 MG PO TABS
25.0000 mg | ORAL_TABLET | Freq: Every evening | ORAL | Status: DC | PRN
  Administered 2019-09-25: 50 mg via ORAL
  Filled 2019-09-22: qty 1

## 2019-09-22 MED ORDER — ACETAMINOPHEN 325 MG PO TABS
650.0000 mg | ORAL_TABLET | Freq: Three times a day (TID) | ORAL | Status: DC
  Administered 2019-09-22 – 2019-10-01 (×23): 650 mg via ORAL
  Filled 2019-09-22 (×25): qty 2

## 2019-09-22 MED ORDER — PHENOL 1.4 % MT LIQD: 1.0000 | OROMUCOSAL | Status: DC | PRN

## 2019-09-22 MED ORDER — ALUM & MAG HYDROXIDE-SIMETH 200-200-20 MG/5ML PO SUSP: 30.0000 mL | ORAL | Status: DC | PRN

## 2019-09-22 MED ORDER — GABAPENTIN 300 MG PO CAPS
300.0000 mg | ORAL_CAPSULE | Freq: Two times a day (BID) | ORAL | Status: DC
  Administered 2019-09-22 – 2019-10-01 (×18): 300 mg via ORAL
  Filled 2019-09-22 (×18): qty 1

## 2019-09-22 MED ORDER — FLEET ENEMA 7-19 GM/118ML RE ENEM: 1.0000 | ENEMA | Freq: Once | RECTAL | Status: DC | PRN

## 2019-09-22 MED ORDER — GLECAPREVIR-PIBRENTASVIR 100-40 MG PO TABS
3.0000 | ORAL_TABLET | ORAL | Status: AC
  Administered 2019-09-22: 3 via ORAL
  Filled 2019-09-22: qty 3

## 2019-09-22 MED ORDER — TRAMADOL HCL 50 MG PO TABS
50.0000 mg | ORAL_TABLET | Freq: Four times a day (QID) | ORAL | Status: DC | PRN
  Administered 2019-09-27 – 2019-10-01 (×7): 50 mg via ORAL
  Filled 2019-09-22 (×9): qty 1

## 2019-09-22 MED ORDER — MENTHOL 3 MG MT LOZG: 1.0000 | LOZENGE | OROMUCOSAL | Status: DC | PRN

## 2019-09-22 MED ORDER — DIPHENHYDRAMINE HCL 12.5 MG/5ML PO ELIX: 12.5000 mg | ORAL_SOLUTION | Freq: Four times a day (QID) | ORAL | Status: DC | PRN

## 2019-09-22 MED ORDER — CYCLOBENZAPRINE HCL 5 MG PO TABS
7.5000 mg | ORAL_TABLET | Freq: Three times a day (TID) | ORAL | Status: DC
  Administered 2019-09-22 – 2019-10-01 (×28): 7.5 mg via ORAL
  Filled 2019-09-22 (×28): qty 2

## 2019-09-22 MED ORDER — GUAIFENESIN-DM 100-10 MG/5ML PO SYRP: 5.0000 mL | ORAL_SOLUTION | Freq: Four times a day (QID) | ORAL | Status: DC | PRN

## 2019-09-22 MED ORDER — PROCHLORPERAZINE 25 MG RE SUPP: 12.5000 mg | Freq: Four times a day (QID) | RECTAL | Status: DC | PRN

## 2019-09-22 MED ORDER — APIXABAN 2.5 MG PO TABS
2.5000 mg | ORAL_TABLET | Freq: Two times a day (BID) | ORAL | Status: DC
  Administered 2019-09-22 – 2019-10-01 (×18): 2.5 mg via ORAL
  Filled 2019-09-22 (×18): qty 1

## 2019-09-22 MED ORDER — PRESCRIPTION MEDICATION: 3.0000 | Freq: Every day | Status: DC

## 2019-09-22 MED ORDER — POLYETHYLENE GLYCOL 3350 17 G PO PACK
17.0000 g | PACK | Freq: Every day | ORAL | Status: DC
  Administered 2019-09-22 – 2019-09-27 (×6): 17 g via ORAL
  Filled 2019-09-22 (×10): qty 1

## 2019-09-22 MED ORDER — GLECAPREVIR-PIBRENTASVIR 100-40 MG PO TABS
3.0000 | ORAL_TABLET | Freq: Every day | ORAL | Status: DC
  Administered 2019-09-23 – 2019-09-24 (×2): 3 via ORAL
  Filled 2019-09-22 (×5): qty 3

## 2019-09-22 MED ORDER — ACETAMINOPHEN 325 MG PO TABS
325.0000 mg | ORAL_TABLET | ORAL | Status: DC | PRN
  Filled 2019-09-22: qty 2

## 2019-09-22 MED ORDER — PROCHLORPERAZINE EDISYLATE 10 MG/2ML IJ SOLN: 5.0000 mg | Freq: Four times a day (QID) | INTRAMUSCULAR | Status: DC | PRN

## 2019-09-22 MED ORDER — POLYETHYLENE GLYCOL 3350 17 G PO PACK
17.0000 g | PACK | Freq: Every day | ORAL | Status: DC | PRN
  Administered 2019-09-23: 17 g via ORAL
  Filled 2019-09-22: qty 1

## 2019-09-22 MED ORDER — PROCHLORPERAZINE MALEATE 5 MG PO TABS: 5.0000 mg | ORAL_TABLET | Freq: Four times a day (QID) | ORAL | Status: DC | PRN

## 2019-09-22 MED ORDER — NALOXONE HCL 0.4 MG/ML IJ SOLN: 0.4000 mg | INTRAMUSCULAR | Status: DC | PRN

## 2019-09-22 MED ORDER — MELOXICAM 7.5 MG PO TABS
15.0000 mg | ORAL_TABLET | Freq: Every day | ORAL | Status: DC
  Administered 2019-09-23 – 2019-10-01 (×9): 15 mg via ORAL
  Filled 2019-09-22 (×10): qty 2

## 2019-09-22 MED ORDER — TAMSULOSIN HCL 0.4 MG PO CAPS
0.4000 mg | ORAL_CAPSULE | Freq: Every day | ORAL | Status: DC
  Administered 2019-09-22 – 2019-10-01 (×10): 0.4 mg via ORAL
  Filled 2019-09-22 (×10): qty 1

## 2019-09-22 MED ORDER — METHADONE HCL 5 MG PO TABS
115.0000 mg | ORAL_TABLET | Freq: Every day | ORAL | Status: DC
  Administered 2019-09-23 – 2019-10-01 (×9): 115 mg via ORAL
  Filled 2019-09-22 (×9): qty 11

## 2019-09-22 MED ORDER — BISACODYL 10 MG RE SUPP: 10.0000 mg | Freq: Every day | RECTAL | Status: DC | PRN

## 2019-09-22 MED ORDER — OXYCODONE HCL 5 MG PO TABS
10.0000 mg | ORAL_TABLET | ORAL | Status: DC | PRN
  Administered 2019-09-22: 15 mg via ORAL
  Administered 2019-09-23: 10 mg via ORAL
  Administered 2019-09-23 (×3): 15 mg via ORAL
  Administered 2019-09-24: 10 mg via ORAL
  Administered 2019-09-24 – 2019-09-27 (×12): 15 mg via ORAL
  Filled 2019-09-22 (×6): qty 3
  Filled 2019-09-22: qty 2
  Filled 2019-09-22 (×9): qty 3
  Filled 2019-09-22: qty 2
  Filled 2019-09-22: qty 3

## 2019-09-22 NOTE — Plan of Care (Signed)

## 2019-09-22 NOTE — Progress Notes (Signed)
Patient admitted on the unit at 1430, assessment completed, vitals stable,. Educated on POC, safety. Call bell within reach bed in lowest position. We continue to monitor.
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
## 2019-09-23 ENCOUNTER — Inpatient Hospital Stay (HOSPITAL_COMMUNITY): Payer: Medicaid Other | Admitting: Physical Therapy

## 2019-09-23 ENCOUNTER — Inpatient Hospital Stay (HOSPITAL_COMMUNITY): Payer: Medicaid Other

## 2019-09-23 DIAGNOSIS — S32402D Unspecified fracture of left acetabulum, subsequent encounter for fracture with routine healing: Principal | ICD-10-CM

## 2019-09-23 LAB — CBC WITH DIFFERENTIAL/PLATELET
Abs Immature Granulocytes: 0.09 10*3/uL — ABNORMAL HIGH (ref 0.00–0.07)
Basophils Absolute: 0 10*3/uL (ref 0.0–0.1)
Basophils Relative: 1 %
Eosinophils Absolute: 0.2 10*3/uL (ref 0.0–0.5)
Eosinophils Relative: 3 %
HCT: 25 % — ABNORMAL LOW (ref 39.0–52.0)
Hemoglobin: 7.8 g/dL — ABNORMAL LOW (ref 13.0–17.0)
Immature Granulocytes: 2 %
Lymphocytes Relative: 33 %
Lymphs Abs: 1.9 10*3/uL (ref 0.7–4.0)
MCH: 28.4 pg (ref 26.0–34.0)
MCHC: 31.2 g/dL (ref 30.0–36.0)
MCV: 90.9 fL (ref 80.0–100.0)
Monocytes Absolute: 0.5 10*3/uL (ref 0.1–1.0)
Monocytes Relative: 9 %
Neutro Abs: 3.1 10*3/uL (ref 1.7–7.7)
Neutrophils Relative %: 52 %
Platelets: 342 10*3/uL (ref 150–400)
RBC: 2.75 MIL/uL — ABNORMAL LOW (ref 4.22–5.81)
RDW: 15.3 % (ref 11.5–15.5)
WBC: 5.9 10*3/uL (ref 4.0–10.5)
nRBC: 0.5 % — ABNORMAL HIGH (ref 0.0–0.2)

## 2019-09-23 LAB — COMPREHENSIVE METABOLIC PANEL
ALT: 18 U/L (ref 0–44)
AST: 18 U/L (ref 15–41)
Albumin: 2.6 g/dL — ABNORMAL LOW (ref 3.5–5.0)
Alkaline Phosphatase: 75 U/L (ref 38–126)
Anion gap: 9 (ref 5–15)
BUN: 16 mg/dL (ref 6–20)
CO2: 26 mmol/L (ref 22–32)
Calcium: 8.5 mg/dL — ABNORMAL LOW (ref 8.9–10.3)
Chloride: 100 mmol/L (ref 98–111)
Creatinine, Ser: 0.67 mg/dL (ref 0.61–1.24)
GFR calc Af Amer: >60 mL/min (ref >60)
GFR calc non Af Amer: >60 mL/min (ref >60)
Glucose, Bld: 100 mg/dL — ABNORMAL HIGH (ref 70–99)
Potassium: 4.1 mmol/L (ref 3.5–5.1)
Sodium: 135 mmol/L (ref 135–145)
Total Bilirubin: 0.6 mg/dL (ref 0.3–1.2)
Total Protein: 6.5 g/dL (ref 6.5–8.1)

## 2019-09-23 LAB — GLUCOSE, CAPILLARY
Glucose-Capillary: 88 mg/dL (ref 70–99)
Glucose-Capillary: 103 mg/dL — ABNORMAL HIGH (ref 70–99)
Glucose-Capillary: 97 mg/dL (ref 70–99)
Glucose-Capillary: 102 mg/dL — ABNORMAL HIGH (ref 70–99)

## 2019-09-23 NOTE — Progress Notes (Signed)
Occupational Therapy Session Note  Patient Details  Name: James [name] MRN: [id number] Date of Birth: 1980-04-21  Today's Date: 09/23/2019 OT Individual Time: 1115-1200 OT Individual Time Calculation (min): 45 min    Short Term Goals: Week 1:  OT Short Term Goal 1 (Week 1): STG= LTG d/t ELOS  Skilled Therapeutic Interventions/Progress Updates:  Pt received in recliner with 8/10 pain in his L hip and shoulder, requesting pain medication. Pt completed stand pivot transfer from the recliner to the w/c with min A using hemi walker. Cushion was removed from w/c to improve pt ability to propel chair with hemi method. Pt propelled to the nurses desk where his RN was waiting with requested pain medication. Pt propelled w/c with min cueing for technique to the tub room. He was given demonstration on use of TTB and suction cup grab bars. Extra edu provided on techniques to adhere to hip precautions. Pt was able to return demonstration with min A and min cueing for safety. Discussed home set up and ways to increase safety and caregiver instruction overall. Pt transferred back to w/c with CGA. Pt was then taken into the ADL apt where he completed regular bed transfer. Discussed adaptations to ensure accessibility to his home environment, taking socioeconomic status into account with recommendations. Pt completed bed transfer with min cueing for precaution adherence with min A overall. Pt returned to his room and transferred back to bed. Pt was left supine and provided with cup of coffee per his request. Bed alarm set.   Therapy Documentation Precautions:  Precautions Precautions: Fall, Posterior Hip Precaution Booklet Issued: No Precaution Comments: reviewed precautions Required Braces or Orthoses: Sling Restrictions Weight Bearing Restrictions: Yes LUE Weight Bearing: Non weight bearing LLE Weight Bearing: Touchdown weight bearing Other Position/Activity Restrictions: post hip precautions; no L  shoulder abduction   Therapy/Group: Individual Therapy  Curtis Sites 09/23/2019, 12:45 PM
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
## 2019-09-23 NOTE — Progress Notes (Signed)
Inpatient Rehabilitation Medication Review by a Pharmacist  A complete drug regimen review was completed for this patient to identify any potential clinically significant medication issues.  Clinically significant medication issues were identified:  no  Check AMION for pharmacist assigned to patient if future medication questions/issues arise during this admission.  Pharmacist comments: no discharge note written on 9/18, reviewed H&P and prior inpatient med list, no issues identified  Time spent performing this drug regimen review (minutes):  15   Laverna Peace, PharmD PGY-1 Pharmacy Resident 09/23/2019 11:54 AM

## 2019-09-23 NOTE — Progress Notes (Signed)
Physical Therapy Session Note  Patient Details  Name: James [name] MRN: [id number] Date of Birth: 1980-04-01  Today's Date: 09/23/2019 PT Individual Time: 1345-1415 PT Individual Time Calculation (min): 30 min   Short Term Goals: Week 1:  PT Short Term Goal 1 (Week 1): =LTG due to ELOS  Skilled Therapeutic Interventions/Progress Updates:    Pt received seated in bed, agreeable to PT session. Pt reports 6/10 pain in L shoulder and hip, use of ice packs prior to and at end of session for pain management. Bed mobility CGA. Stand pivot transfer bed to/from w/c with HW and CGA. Manual w/c propulsion to/from ortho gym with use of R UE/LE at Supervision level. Demonstrated how to perform safe car transfer and discussed possible need to transfer into backseat of car in order to adhere to post hip precautions on LLE. Pt able to perform return demo of car transfer with min A and cues to adhere to precautions. Pt demonstrates good awareness and understanding of adjustments that need to be made in order to safely transfer in/out of a car. Pt currently unsure of type of vehicle he will d/c home in. Assisted pt back to bed at end of session, similar transfer method as noted above. Pt left semi-reclined in bed with needs in reach, bed alarm in place at end of session.  Therapy Documentation Precautions:  Precautions Precautions: Fall, Posterior Hip Precaution Booklet Issued: No Precaution Comments: reviewed precautions Required Braces or Orthoses: Sling Restrictions Weight Bearing Restrictions: Yes LUE Weight Bearing: Non weight bearing LLE Weight Bearing: Touchdown weight bearing Other Position/Activity Restrictions: post hip precautions; no L shoulder abduction    Therapy/Group: Individual Therapy   Excell Seltzer, PT, DPT  09/23/2019, 4:44 PM

## 2019-09-23 NOTE — Progress Notes (Addendum)
Patient has  PVR of 404 cc. He was  encouraged to double void but has not tried. He was informed of in and out catheterization to empty bladder but refused. Patient stated he will try to void later. Patient also refused suppository this morning; last BM per pt was 6 days ago

## 2019-09-24 ENCOUNTER — Inpatient Hospital Stay (HOSPITAL_COMMUNITY): Payer: Medicaid Other | Admitting: Occupational Therapy

## 2019-09-24 ENCOUNTER — Inpatient Hospital Stay (HOSPITAL_COMMUNITY): Payer: Medicaid Other

## 2019-09-24 LAB — BASIC METABOLIC PANEL
Anion gap: 7 (ref 5–15)
BUN: 11 mg/dL (ref 6–20)
CO2: 27 mmol/L (ref 22–32)
Calcium: 8.7 mg/dL — ABNORMAL LOW (ref 8.9–10.3)
Chloride: 103 mmol/L (ref 98–111)
Creatinine, Ser: 0.67 mg/dL (ref 0.61–1.24)
GFR calc Af Amer: >60 mL/min (ref >60)
GFR calc non Af Amer: >60 mL/min (ref >60)
Glucose, Bld: 128 mg/dL — ABNORMAL HIGH (ref 70–99)
Potassium: 4.2 mmol/L (ref 3.5–5.1)
Sodium: 137 mmol/L (ref 135–145)

## 2019-09-24 LAB — GLUCOSE, CAPILLARY: Glucose-Capillary: 70 mg/dL (ref 70–99)

## 2019-09-24 MED ORDER — SORBITOL 70 % SOLN
30.0000 mL | Freq: Once | Status: AC
  Administered 2019-09-24: 30 mL via ORAL
  Filled 2019-09-24: qty 30

## 2019-09-24 MED ORDER — ACETAMINOPHEN 325 MG PO TABS: 650.0000 mg | ORAL_TABLET | Freq: Three times a day (TID) | ORAL | Status: AC

## 2019-09-24 MED ORDER — BISACODYL 10 MG RE SUPP
10.0000 mg | Freq: Once | RECTAL | Status: DC
  Filled 2019-09-24 (×4): qty 1

## 2019-09-24 MED ORDER — NALOXEGOL OXALATE 12.5 MG PO TABS
12.5000 mg | ORAL_TABLET | Freq: Every day | ORAL | Status: DC
  Administered 2019-09-24 – 2019-10-01 (×8): 12.5 mg via ORAL
  Filled 2019-09-24 (×9): qty 1

## 2019-09-24 NOTE — Progress Notes (Signed)
Per pt, LBM was 9/13; pt does not have a previous bowel movement charted. Pt received PRN Miralax during this shift. Pt refused PRN suppository and enema, but did state willing to take said medications if he did not have bowel movement today. Pt educated on possible complications and need to have proper bowel emptying.
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
## 2019-09-24 NOTE — Progress Notes (Signed)
Occupational Therapy Session Note  Patient Details  Name: James [name] MRN: [id number] Date of Birth: 09/13/1980  Today's Date: 09/24/2019 OT Individual Time: 1010-1104 and 3875-6433 OT Individual Time Calculation (min): 54 min and 54 min   Short Term Goals: Week 1:  OT Short Term Goal 1 (Week 1): STG= LTG d/t ELOS  Skilled Therapeutic Interventions/Progress Updates:    Pt greeted at time of session sitting up in recliner agreeable to OT session, pt with some pain and RN aware, performed med pass. SPT with HM to wheelchair with Min A, cues to safely pivot on RLE to maintain TDWB on LLE, pt knows to maintain but had min difficulty with carryover. Pt wanted to practice commode transfers, performed x2 with grab bar first trial and BSC armrest for second trial to see what felt more natural, pt liked to push up from [facility] armrest and use grab bar as stabilizer during transfer, performed both transfers with CGA/Min. Discussed options for suction cup grab bars vs. Mounted. Wheelchair propulsion with BLEs (not weight bearing through LLE) and RUE only for propulsion with Supervision around obstacles in hall. SPT back to chair CGA/Min toward L side/more difficult side. Set up for comfort in chair with alarm on, call bell in reach.   Session 2: Pt greeted at time of session reclined in bed resting but agreeable to OT session. Supine to sit Supervision, SPT with HW with Min/CGA while pivoting on RLE. Pt self propelled to gym with supervision for occasional verbal cues, SPT to therapy mat CGA with HW. Dynamic sitting for ball toss 2x50 with 1.5# weight on R wrist, increased to 3# for second round. Dynamic sit up with wedge on mat for 3x10 with dynamic ball toss with coming up to sitting to improve RUE strength and core strength. SPT back to wheelchair CGA with HW, brought to ADL apartment and briefly reviewed tub/shower set up and his bathroom set up when going home with parents, very similar to set up here and  will have tub, wants TTB. Brought back to room via wheelchair and SPT to bed with bedrail and no HM CGA. Sit to supine Supervision and alarm on, call bell in reach. Note pt had several questions about progression of recovery of shoulder and hip, answered questions from OT perspective and recommended he f/u with PT for hip specific questions.   Therapy Documentation Precautions:  Precautions Precautions: Fall, Posterior Hip Precaution Booklet Issued: No Precaution Comments: reviewed precautions Required Braces or Orthoses: Sling Restrictions Weight Bearing Restrictions: Yes LUE Weight Bearing: Non weight bearing LLE Weight Bearing: Touchdown weight bearing Other Position/Activity Restrictions: post hip precautions; no L shoulder abduction     Therapy/Group: Individual Therapy  Viona Gilmore 09/24/2019, 12:19 PM

## 2019-09-24 NOTE — Progress Notes (Signed)
Physical Therapy Session Note  Patient Details  Name: James [name] MRN: [id number] Date of Birth: 10/20/1980  Today's Date: 09/24/2019 PT Individual Time: 0805-0902 PT Individual Time Calculation (min): 57 min   Short Term Goals: Week 1:  PT Short Term Goal 1 (Week 1): =LTG due to ELOS  Skilled Therapeutic Interventions/Progress Updates:     Pt received supine in bed and agreeable to therapy. Reports pain L shoulder and LLE. Number not provided. PT provides repositioning and rest breaks to manage pain symptoms. Supine<>sit during session with supervision and cues for positioning. SPT to [place] with hemiwalker and minA. Pt self propels WC x200' with RUE and RLE. PT provides pt with lower seated WC to allow for easier use of RLE to steer and propel WC. Pt also able to use LLE to propel WC. PT educates pt on not pivoting or performing lateral movements with LLE to prevent breaking hip precautions. Pt self propels WC x200' additional to gym. Squat pivot transfer to mat table with HHA minA. Pt performs supine therex for LLE strengthening 1x15:  Heel Slides SLRs Hip Abduction SAQs  Pt performs 2x5 reps sit to stand with hemiwalker for transfer training and strengthening of RLE, requiring CGA from PT. Squat pivot from mat>WC>recliner with minA/CGA. Left seated in recliner with alarm intact and all needs within reach.  Therapy Documentation Precautions:  Precautions Precautions: Fall, Posterior Hip Precaution Booklet Issued: No Precaution Comments: reviewed precautions Required Braces or Orthoses: Sling Restrictions Weight Bearing Restrictions: Yes LUE Weight Bearing: Non weight bearing LLE Weight Bearing: Touchdown weight bearing Other Position/Activity Restrictions: post hip precautions; no L shoulder abduction    Therapy/Group: Individual Therapy  Breck Coons, PT, DPT 09/24/2019, 12:15 PM
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
## 2019-09-24 NOTE — Progress Notes (Addendum)
Inpatient Rehabilitation Center Individual Statement of Services  Patient Name:  James [name]  Date:  09/24/2019  Welcome to the Inpatient Rehabilitation Center.  Our goal is to provide you with an individualized program based on your diagnosis and situation, designed to meet your specific needs.  With this comprehensive rehabilitation program, you will be expected to participate in at least 3 hours of rehabilitation therapies Monday-Friday, with modified therapy programming on the weekends.  Your rehabilitation program will include the following services:  Physical Therapy (PT), Occupational Therapy (OT), 24 hour per day rehabilitation nursing, Neuropsychology, Care Coordinator, Rehabilitation Medicine, Nutrition Services and Pharmacy Services  Weekly team conferences will be held on Tuesday to discuss your progress.  Your Inpatient Rehabilitation Care Coordinator will talk with you frequently to get your input and to update you on team discussions.  Team conferences with you and your family in attendance may also be held.  Expected length of stay: 7-10 days  Overall anticipated outcome: independent with device-bathing and dressing-supervision level  Depending on your progress and recovery, your program may change. Your Inpatient Rehabilitation Care Coordinator will coordinate services and will keep you informed of any changes. Your Inpatient Rehabilitation Care Coordinator's name and contact numbers are listed  below.  The following services may also be recommended but are not provided by the Inpatient Rehabilitation Center:   Driving Evaluations  Home Health Rehabiltiation Services  Outpatient Rehabilitation Services  Vocational Rehabilitation   Arrangements will be made to provide these services after discharge if needed.  Arrangements include referral to agencies that provide these services.  Your insurance has been verified to be:  Medicaid-Family Plannng-also applied for Full  Medicaid Your primary doctor is:  Joette Catching  Pertinent information will be shared with your doctor and your insurance company.  Inpatient Rehabilitation Care Coordinator:  Dossie Der, [place] [phone] or Luna Glasgow  Information discussed with and copy given to patient by: Lucy Chris, 09/24/2019, 1:05 PM

## 2019-09-24 NOTE — Progress Notes (Signed)
Inpatient Rehabilitation  Patient information reviewed and entered into eRehab system by Devra Stare M. Nozomi Mettler, M.A., CCC/SLP, PPS Coordinator.  Information including medical coding, functional ability and quality indicators will be reviewed and updated through discharge.    

## 2019-09-25 ENCOUNTER — Inpatient Hospital Stay (HOSPITAL_COMMUNITY): Payer: Medicaid Other | Admitting: Occupational Therapy

## 2019-09-25 ENCOUNTER — Inpatient Hospital Stay (HOSPITAL_COMMUNITY): Payer: Medicaid Other

## 2019-09-25 NOTE — Progress Notes (Signed)
Occupational Therapy Session Note  Patient Details  Name: James [name] MRN: [id number] Date of Birth: 02/26/1980  Today's Date: 09/25/2019 OT Individual Time: 5784-6962 and 9528-4132 OT Individual Time Calculation (min): 53 min and 70 min   Short Term Goals: Week 1:  OT Short Term Goal 1 (Week 1): STG= LTG d/t ELOS  Skilled Therapeutic Interventions/Progress Updates:    Pt greeted at time of session sitting up in recliner with mild c/o pain, premedicated. SPT with HM to wheelchair CGA, brought via wheelchair to bathroom and practiced toilet transfers x2 with CGA with BSC over toilet and with/without grab bar to determine best fit for home. PT wants to do suction cup grab bar and BSC over top of toilet for now, maybe do built in grab bars eventually. Pt self propel to ADL apartment with supervision and maneuvered on carpetted surface for practice at home, squat pivot to/from couch x2 with cues to maintain hip precautions and WB status, performed with CS/CGA. Pt ed/discussion for DC planning to go home, pt states he will be sleeping on couch for a while. IADL tasks in kitchen for retireving items from fridge while maintaining precautions and from wheelchair level, performed with CS. SPT back to chair with CGA, alarm on, call bell in reach.   Session 2: Pt greeted at time of session sitting up in recliner agreeable to OT session. Discussing DME needs, pt stated his uncle has a BSC that he can use and pt stated he wanted to use this in the shower as well instead of buying TTB. Pt ed that a standard BSC would require him to step over tub wall and he would not be able to do this and maintain TDWB on LLE and pt verbalized understanding. Brought to shower room and demonstrated for increased understanding how he could not use a standard BSC in the shower at this time. Pt wanting to know about drop arm BSC, therapist demonstrated how this may work to maintain WB status but still highly recommending TTB for  max safety. Pt is going to f/u with uncle to determine what kind of BSC he has and is aware that he will likely need TTB but does not want to spend extra $$. Pt performed TTB transfer with CGA without AD including bringing legs over tub wall, transferred back to w/c in same manner. Self propelled wheelchair with supervision to elevator while maneuvering obstacles and taking turns with supervision, brought outside for uplifting mood and fresh air, pt enjoyed being outside. Self propelled back to elevator, therapist providing assistance back to room and pt SPT to recliner CGA without AD and maintaining precautions. Alarm on, call bell in reach.    Therapy Documentation Precautions:  Precautions Precautions: Fall, Posterior Hip Precaution Booklet Issued: No Precaution Comments: reviewed precautions Required Braces or Orthoses: Sling Restrictions Weight Bearing Restrictions: Yes LUE Weight Bearing: Non weight bearing LLE Weight Bearing: Touchdown weight bearing Other Position/Activity Restrictions: post hip precautions; no L shoulder abduction     Therapy/Group: Individual Therapy  Viona Gilmore 09/25/2019, 11:35 AM
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
## 2019-09-25 NOTE — Progress Notes (Signed)
Patient ID: James [name], male   DOB: 25-Nov-1980, 39 y.o.   MRN: [id number]  Met with pt to discuss team conference mod/i level goals wheelchair level and target discharge date 9/25. Will need car insurance so can order equipment for him and made aware tub bench and hemi-walker are not covered. He will talk with step-mom and try to get information so equipment can be ordered. Go back this afternoon after pt talks with step-mom at lunch.
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
## 2019-09-25 NOTE — Patient Care Conference (Signed)
Inpatient RehabilitationTeam Conference and Plan of Care Update Date: 09/25/2019   Time: 11:33 AM    Patient Name: James [name]      Medical Record Number: [id number]  Date of Birth: [birth year] Sex: Male         Room/Bed: 4M03C/4M03C-01 Payor Info: Payor: /    Admit Date/Time:  09/22/2019  3:27 PM  Primary Diagnosis:  Acetabulum fracture, left [facility])  Hospital Problems: Principal Problem:   Acetabulum fracture, left ([place]) Active Problems:   Closed fracture of left proximal humerus   Multiple traumatic injuries    Expected Discharge Date: Expected Discharge Date: 09/29/19  Team Members Present: Physician leading conference: Dr. Courtney Heys Care Coodinator Present: Dorthula Nettles, RN, BSN, CRRN;Other (comment) (Becky Dupree, LCSW) Nurse Present: Suella Grove, RN PT Present: Tereasa Coop, PT OT Present: Lillia Corporal, OT PPS Coordinator present : Ileana Ladd, Burna Mortimer, SLP     Current Status/Progress Goal Weekly Team Focus  Bowel/Bladder   Pt continent of B/B LBM 09/24/2019  for pt to maintain a regular bowel regime  Assess B/B every shift and PRN   Swallow/Nutrition/ Hydration             ADL's   Min overall for bathing, dressing, and toileting. toilet transfers Min/CGA with BSC handles/grab bar  Mod I  LB bathing/dressing, continue to bathe at shower level, ADL transfers   Mobility   CGA bed mobility, transfers, supervision WC mobility  Mod(I)  transfers, HEP, BLE strengthening, DC prep   Communication             Safety/Cognition/ Behavioral Observations            Pain   Pt receiving PRN oxycodone and Tramadol and scheduled Tylenol. Rating pain 8/10 before pain medication  pain rating <4  Assess pain every shift and PRN   Skin   surgical incision to left shoulder, left hip, and left thigh clean dry and intact  no skin breakdown No S/Sx of infection to incisions  Assess skin every shift and PRN     Discharge Planning:  Going to Dad's and Step-MOm's  home uncle and brother building ramp this weekend. Will be alone during the day while Step-Mom works will need to be mod/i transfers at discharge.   Team Discussion: Incisions CDI, Continent B/B. Pain controlled. OT supervision goals, could get to Mod I goals. PT much improvement from previous week, Mod I goals. Can provide HEP. Patient on target to meet rehab goals: yes  *See Care Plan and progress notes for long and short-term goals.   Revisions to Treatment Plan:  MD to dc PVR's, and begin weaning off of Oxy. Teaching Needs: Continue family education.  Current Barriers to Discharge: Decreased caregiver support, Home enviroment access/layout, Wound care, Weight, Weight bearing restrictions and Medication compliance  Possible Resolutions to Barriers: Teach wound care, dressing changes, begin weaning off of Oxy prn, continue education on weight bearing restrictions.     Medical Summary Current Status: L hip and L arm- incisions look good; LBM 9/20; Home med for Hep C- we don't have- ran out; pt has no one to bring it in; to get ramp  Barriers to Discharge: Weight bearing restrictions;Wound care;Decreased family/caregiver support;Home enviroment access/layout;Weight  Barriers to Discharge Comments: ADLs- min A;  pain is controlled with oxy and methadone- need to wean Oxy by d/c or - 7 days Possible Resolutions to [employer] Focus: d/c Saturday   Continued Need for Acute Rehabilitation Level of Care: The patient  requires daily medical management by a physician with specialized training in physical medicine and rehabilitation for the following reasons: Direction of a multidisciplinary physical rehabilitation program to maximize functional independence : Yes Medical management of patient stability for increased activity during participation in an intensive rehabilitation regime.: Yes Analysis of laboratory values and/or radiology reports with any subsequent need for medication  adjustment and/or medical intervention. : Yes   I attest that I was present, lead the team conference, and concur with the assessment and plan of the team.   Cristi Loron 09/25/2019, 4:27 PM

## 2019-09-26 ENCOUNTER — Inpatient Hospital Stay (HOSPITAL_COMMUNITY): Payer: Medicaid Other | Admitting: Occupational Therapy

## 2019-09-26 ENCOUNTER — Inpatient Hospital Stay (HOSPITAL_COMMUNITY): Payer: Medicaid Other | Admitting: Physical Therapy

## 2019-09-26 ENCOUNTER — Encounter (HOSPITAL_COMMUNITY): Payer: Medicaid Other | Admitting: Psychology

## 2019-09-26 DIAGNOSIS — T07XXXA Unspecified multiple injuries, initial encounter: Secondary | ICD-10-CM
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
## 2019-09-26 NOTE — Progress Notes (Signed)
Pts HR a little elevated to 106 as he is very stressed that his father is in the ICU and is not improving. Pt given trazadone to help sleep tonight.

## 2019-09-26 NOTE — Progress Notes (Signed)
Patient ID: James [name], male   DOB: [birth year], 39 y.o.   MRN: [id number]  Discussed with pt can not find a super hemi height wheelchair and the DME companies will not take the car insurance, he will need to pay monthly rental for it until not needed and get reminbursed by the car insurance. He is willing to talk with [employer] and see the monthly rental and get a hemi height wheelchair. Continue to work on discharge needs.

## 2019-09-26 NOTE — Progress Notes (Signed)
Physical Therapy Session Note  Patient Details  Name: James [name] MRN: [id number] Date of Birth: [birth year]  Today's Date: 09/26/2019 PT Individual Time: 6606-3016 PT Individual Time Calculation (min): 45 min   Short Term Goals: Week 1:  PT Short Term Goal 1 (Week 1): =LTG due to ELOS  Skilled Therapeutic Interventions/Progress Updates:    Pt received seated in recliner in room, agreeable to PT session. Pt reports feeling overwhelmed currently due to his necessary DME not being covered as well as concern regarding his dad who is still in ICU and he is unable to visit. Provided emotional support throughout session. Pt is at [facility] level for stand pivot transfers w/c to/from various surfaces with good adherence to [facility] precautions. Car transfer to small SUV height car and sedan height vehicle in order to simulate various scenarios to plan for d/c home. Pt able to complete transfers at [facility] level with cues for scooting his hips back into the car prior to bringing LE in/out of car in order to adhere to L hip precautions. Still recommending pt sit in back seat of car if possible. Remainder of session focus on L shoulder PROM flexion and abduction to tolerance. Pt left seated in bed with needs in reach, ice pack in place to L shoulder and L hip for pain management at end of session.  Therapy Documentation Precautions:  Precautions Precautions: Fall, Posterior Hip Precaution Booklet Issued: No Precaution Comments: reviewed precautions Required Braces or Orthoses: Sling Restrictions Weight Bearing Restrictions: Yes LUE Weight Bearing: Non weight bearing LLE Weight Bearing: Touchdown weight bearing Other Position/Activity Restrictions: post hip precautions; no L shoulder abduction    Therapy/Group: Individual Therapy   Excell Seltzer, PT, DPT  09/26/2019, 5:36 PM

## 2019-09-26 NOTE — Progress Notes (Signed)
Occupational Therapy Session Note  Patient Details  Name: James [name] MRN: [id number] Date of Birth: 1980/01/07  Today's Date: 09/26/2019 OT Individual Time: 4098-1191 and 4782-9562 OT Individual Time Calculation (min): 69 min and 42 min   Short Term Goals: Week 1:  OT Short Term Goal 1 (Week 1): STG= LTG d/t ELOS  Skilled Therapeutic Interventions/Progress Updates:    Pt greeted at time of session sitting up in wheelchair agreeable to OT session and wanting to shower. Self propel to bathroom with Supervision and only Min A to get over doorway threshold, SPT without AD to TTB in shower with CGA with environment modified to simulate bathroom at home. Dressings covered appropriately to prevent water getting in - wants to know if he will need to cover at home and TBD. UB bathing supervision to maintain precautions, LB bathing with LHS to wash feet and back with lateral leans for washing buttocks. Dried off in same manner and educated on techniques to dry buttocks in sitting, SPT to wheelchair CGA while maintaining precautions. Educated to already have clothes in the bathroom with him at home as it will be a shared bathroom and conserve energy. LB dress CGA with pt able to thread underwear and pants with reacher, CGA only for static standing on one leg to don over hips. Donned socks with Max A, plan to teach sock aide. PROM performed for LUE within orthopedic precautions for flexion/extension with within his pain tolerance, encouraged to do elbow and forearm movements to prevent stiffness. Donned sline with supervision, SPT back to recliner CGA and alarm on call bell in reach.   Session 2: Pt greeted at time of session sitting up in recliner agreeable to OT session with mild c/o pain, repositioning and rest breaks PRN. SPT recliner to wheelchair with CGA, verbally reviewed recommendations for TTB instead of drop arm BSC, pt is aware and is going to look at [employer], etc. Pt ed for  doffing socks with reacher and donning with sock aide, able to perform with Min A. Donned shoes with more difficulty but introduced shoe horn, pt stating he has a shorter version at home that will help maintain precautions. Self propelled to/from elevators and brought outside for fresh air and uplift mood. Self propelled throughout outside sitting area on various textures and surfaces with Supervision. Brought back to room and SPT to recliner CGA/CS. Alarm on, call bell in reach.    Therapy Documentation Precautions:  Precautions Precautions: Fall, Posterior Hip Precaution Booklet Issued: No Precaution Comments: reviewed precautions Required Braces or Orthoses: Sling Restrictions Weight Bearing Restrictions: Yes LUE Weight Bearing: Non weight bearing LLE Weight Bearing: Touchdown weight bearing Other Position/Activity Restrictions: post hip precautions; no L shoulder abduction     Therapy/Group: Individual Therapy  Viona Gilmore 09/26/2019, 12:17 PM
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
## 2019-09-26 NOTE — Progress Notes (Signed)
Physical Therapy Session Note  Patient Details  Name: James [name] MRN: [id number] Date of Birth: Jan 22, 1980  Today's Date: 09/26/2019 PT Individual Time: 3762-8315 PT Individual Time Calculation (min): 43 min   Short Term Goals: Week 1:  PT Short Term Goal 1 (Week 1): =LTG due to ELOS  Skilled Therapeutic Interventions/Progress Updates: Pt presented sitting EOB agreeable to therapy. Pt states pain currently 7/10 and currently premedicated. Performed squat pivot transfer to w/c with CGA. Pt propelled to rehab gym ~168f with supervision using R hemi technique. Performed squat pivot transfer to mat in same manner as prior and transferred to supine with supervision. Pt participated in supine therex as follows: heel slides, hip abd/add, SAQ, and SLR from 5in elevation (foot placed on ball). All performed 2 x 10 bilaterally. Pt also instructed in heel cord stretch with use of gait belt, pt instructed in not to use heavy force for stretch with pt demonstrated understanding. Pt required minA for supine to sit and performed STS x 5 with use of HW and CGA. Pt then performed stand pivot with HW to return to w/c and propelled back to room in same manner as prior. Pt requests to remain in w/c and left with call bell within reach and needs met.      Therapy Documentation Precautions:  Precautions Precautions: Fall, Posterior Hip Precaution Booklet Issued: No Precaution Comments: reviewed precautions Required Braces or Orthoses: Sling Restrictions Weight Bearing Restrictions: Yes LUE Weight Bearing: Non weight bearing LLE Weight Bearing: Touchdown weight bearing Other Position/Activity Restrictions: post hip precautions; no L shoulder abduction General:   Vital Signs:  Pain: Pain Assessment Pain Scale: 0-10 Pain Score: 9  Pain Type: Acute pain Pain Location: Shoulder Pain Orientation: Left Pain Descriptors / Indicators: Aching Pain Frequency: Intermittent Pain Onset: On-going Patients  Stated Pain Goal: 2 Pain Intervention(s): Medication (See eMAR)    Therapy/Group: Individual Therapy  Maddalena Linarez  Vivion Romano, PTA  09/26/2019, 12:51 PM

## 2019-09-27 ENCOUNTER — Inpatient Hospital Stay (HOSPITAL_COMMUNITY): Payer: Medicaid Other | Admitting: Physical Therapy

## 2019-09-27 ENCOUNTER — Inpatient Hospital Stay (HOSPITAL_COMMUNITY): Payer: Medicaid Other | Admitting: Occupational Therapy

## 2019-09-27 MED ORDER — OXYCODONE HCL 5 MG PO TABS
10.0000 mg | ORAL_TABLET | Freq: Three times a day (TID) | ORAL | Status: AC | PRN
  Administered 2019-09-27: 10 mg via ORAL
  Filled 2019-09-27: qty 2

## 2019-09-27 MED ORDER — OXYCODONE HCL 5 MG PO TABS: 10.0000 mg | ORAL_TABLET | Freq: Four times a day (QID) | ORAL | Status: DC | PRN

## 2019-09-27 MED ORDER — OXYCODONE HCL 5 MG PO TABS
5.0000 mg | ORAL_TABLET | Freq: Three times a day (TID) | ORAL | Status: DC | PRN
  Filled 2019-09-27: qty 1

## 2019-09-27 MED ORDER — OXYCODONE HCL 5 MG PO TABS
5.0000 mg | ORAL_TABLET | Freq: Three times a day (TID) | ORAL | Status: DC | PRN
  Administered 2019-09-28 – 2019-10-01 (×8): 5 mg via ORAL
  Filled 2019-09-27 (×10): qty 1

## 2019-09-27 NOTE — Progress Notes (Addendum)
Patient ID: James [name], male   DOB: 1980-02-06, 39 y.o.   MRN: [id number]  Met with pt to discuss if he can rent the wheelchair form [employer]. He reports it is too much money-$85.00 a month. He went on line and bought one for $ 25.00. have a wheelchair patient gave to the hospital may need to see if can use until he can get the one he ordered on-line. PT to try with pt today.   10:02-Am Match completed for pt and Pam-PA aware to sent meds to [facility]. Will give [facility] [place] information so he can follow up with for a PCP. Has MD through [facility] for his back issues
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
## 2019-09-27 NOTE — Progress Notes (Signed)
Occupational Therapy Session Note  Patient Details  Name: James [name] MRN: [id number] Date of Birth: 11-13-1980  Today's Date: 09/27/2019 OT Individual Time: 1048-1200 OT Individual Time Calculation (min): 72 min    Short Term Goals: Week 1:  OT Short Term Goal 1 (Week 1): STG= LTG d/t ELOS  Skilled Therapeutic Interventions/Progress Updates:    Pt greeted at time of session sitting up at EOB d/o pain, RN provided med pass for 8/10 pain. SPT to wheelchair (new one that he will go home with) with CS. Self propelled to/from gym with Supervision to take turns, SPT to mat with CS without AD for all tranfers with stand pivot on RLE only, remembers to reach back for armrests/surfaces. Static and dynamic standing activities on RLE only to improve standing balance and RLE strength for single leg stance, stood for 2 rounds of 1:40 and 2:15 while performing dynamic activity with R hand. Pt ed/training on performing self ROM/PROM for LUE for shoulder flexion/extension and active elbow/wrist/digit movement when going home with good carryover, pt is aware no active abduction. Pt self propelled up/down ramp at pt request for 2 trials, FWD facing with Min A and decreased safety d/t no anti-tippers, self propelled backwards with Supervision for safety only in new environment and improved safety compared to forward facing. SPT back to recliner with CS without AD. Alarm on, call bell in reach. Discussed with pt continued recommendation for TTB for showers, continues to decline at this time but is aware of multiple venues to purchase.   Therapy Documentation Precautions:  Precautions Precautions: Fall, Posterior Hip Precaution Booklet Issued: No Precaution Comments: reviewed precautions pt able to recall 3/3 Required Braces or Orthoses: Sling Restrictions Weight Bearing Restrictions: Yes LUE Weight Bearing: Non weight bearing LLE Weight Bearing: Touchdown weight bearing Other Position/Activity  Restrictions: post hip precautions; no L shoulder active abduction     Therapy/Group: Individual Therapy  Viona Gilmore 09/27/2019, 12:37 PM
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
## 2019-09-27 NOTE — Progress Notes (Signed)
Physical Therapy Session Note  Patient Details  Name: Margaret Staggs MRN: [id number] Date of Birth: 07/29/1980  Today's Date: 09/27/2019 PT Individual Time: 0903-1000 PT Individual Time Calculation (min): 57 min   Short Term Goals: Week 1:  PT Short Term Goal 1 (Week 1): =LTG due to ELOS  Skilled Therapeutic Interventions/Progress Updates: Pt presents supi ne I n bed, but agreeable to therapy.  Pt transfers sup to sit w/ supervision using side rail, but also transfers on mat table w/o rail. Pt required assist to don L sling.   Pt transfers sit to stand w/ supervision and then SPT bed > w/c w/ CGA to supervision.  Pt transferred into new w/c as pt had to order w/c and unsure when will be delivered.  SW brought a Materials engineer chair for use and higher than w/c 2/2 need to use RLE to negotiate (hemi technique used 2/2 WB restrictions L extremities).  Cushion removed and able to negotiate w/c  150' on even and uneven(outdoor) surfaces.  Pt transferred w/c > mat table w/ CGA to supervision in order for PT to lower w/c.  Pt performed LE there ex in seated and supine including 3 x 10 LAQ, HS, abd/add.  Pt negotiated lowered w/c w/ independence to room.  Pt transferred SPT w/c > bed w/ CGA to supervision and supervision for sit to supine.  Bed alarm on and all needs in reach.  PT spoke w/ Jacqlyn Larsen, SW re: need for Fax # per Dr Dagoberto Ligas request.     Therapy Documentation Precautions:  Precautions Precautions: Fall, Posterior Hip Precaution Booklet Issued: No Precaution Comments: reviewed precautions Required Braces or Orthoses: Sling Restrictions Weight Bearing Restrictions: Yes LUE Weight Bearing: Non weight bearing LLE Weight Bearing: Touchdown weight bearing Other Position/Activity Restrictions: post hip precautions; no L shoulder abduction General:   Vital Signs:   Pain: 6/10 L UE, hip.      Therapy/Group: Individual Therapy  Ladoris Gene 09/27/2019, 10:43 AM

## 2019-09-27 NOTE — Progress Notes (Signed)
Physical Therapy Session Note  Patient Details  Name: James [name] MRN: [id number] Date of Birth: 05/07/1980  Today's Date: 09/27/2019 PT Individual Time: 1420-1534 PT Individual Time Calculation (min): 74 min   Short Term Goals: Week 1:  PT Short Term Goal 1 (Week 1): =LTG due to ELOS  Skilled Therapeutic Interventions/Progress Updates:    Pt received asleep, supine in bed and upon awakening agreeable to therapy session. Supine>sitting R EOB, mod-I. Sitting EOB donned L UE sling mod assist for time management. Pt reports he will not have HW at home therefore need to continue practicing transfers without it. L stand pivot EOB>w/c using R UE support and pt able to maintain L LE TDWB with close supervision for safety. R hemi-technique w/c propulsion throughout session mod-I - pt reports some increased resistance to forward movement of w/c now that the caster wheels have been lowered but declines readjusting them. Simulated car transfer (sedan height) to/from w/c without AD with close supervision for safety - pt able to recall need to recline trunk to maintain L LE hip precautions while placing LEs in vehicle. Stand pivot w/c<>EOM with supervision - again pt demoing good ability to maintain L LE TDWBing - therapist having to assist with L w/c brake.  Performed the following LE exercises with education on which ones to perform with R LE vs L LE and how to maintain L LE precautions throughout: - Supine Ankle Pumps - 1 x daily - 7 x weekly - 2 sets - 25 reps - lie in this position for 2 minutes hold - Supine Gluteal Sets - 1 x daily - 7 x weekly - 2 sets - 10 reps - 5 seconds hold - Supine L LE Heel Slide - 1 x daily - 7 x weekly - 2 sets - 20 reps - Supine L LE Straight leg raise - 1 x daily - 7 x weekly - 2 sets - 15 reps - Supine L LE Hip Abduction - 1 x daily - 7 x weekly - 2 sets - 20 reps - Supine R LE Bridge with L LE extended to maintain TDWB - 1 x daily - 7 x weekly - 2 sets - 10 reps - L  LE Clamshell with pillow between knees to maintain precautions- 1 x daily - 7 x weekly - 2 sets - 10 reps Therapist provided pt with printed HEP. Pt reports concern of performing self PROM to L UE as he may not receive follow-up therapy due to insurance limitations. Performed seated in w/c L UE shoulder adduction/abduction table slides using arm on towel and yard stick in R hand to move the arm passively into abduction then pulling on towel with R hand to move L arm back into adduction to ensure pt does not actively move the L arm with pt demoing good understanding. R hemi-technique w/c propulsion back to room and stand pivot to EOB supervision. Sit>supine mod-I. Therapist performed 62mnutes of gentle L UE shoulder PROM: shoulder adduction to side and abduction to ~70degrees, shoulder IR to stomach and shoulder ER to neutral (90degrees/forearm pointed up towards ceiling) and shoulder flexion to ~40degrees all in the scapular plan of motion - pt denies pain with this. Pt left supine in bed with needs in reach and bed alarm on.    Therapy Documentation Precautions:  Precautions Precautions: Fall, Posterior Hip Precaution Booklet Issued: No Precaution Comments: reviewed precautions pt able to recall 3/3 Required Braces or Orthoses: Sling Restrictions Weight Bearing Restrictions: Yes LUE Weight  Bearing: Non weight bearing LLE Weight Bearing: Touchdown weight bearing Other Position/Activity Restrictions: post hip precautions; no L shoulder active abduction   Pain: Reports L shoulder becomes sore when lying on mat table without pillow supporting it (despite sling) during LE exercises - repositioned into sitting for pain management with pt reporting decreased pain.   Therapy/Group: Individual Therapy  Tawana Scale , PT, DPT, CSRS  09/27/2019, 2:54 PM

## 2019-09-27 NOTE — Plan of Care (Signed)
°  Problem: Consults Goal: RH GENERAL PATIENT EDUCATION Description: See Patient Education module for education specifics. Outcome: Progressing   Problem: RH SKIN INTEGRITY Goal: RH STG SKIN FREE OF INFECTION/BREAKDOWN Description: No skin breakdown this admission with cues Outcome: Progressing Goal: RH STG MAINTAIN SKIN INTEGRITY WITH ASSISTANCE Description: STG Maintain Skin Integrity With mod I Assistance. Outcome: Progressing Goal: RH STG ABLE TO PERFORM INCISION/WOUND CARE W/ASSISTANCE Description: STG Able To Perform Incision/Wound Care With mod I Assistance. Outcome: Progressing   Problem: RH SAFETY Goal: RH STG ADHERE TO SAFETY PRECAUTIONS W/ASSISTANCE/DEVICE Description: STG Adhere to Safety Precautions With min Assistance/Device. Outcome: Progressing   Problem: RH PAIN MANAGEMENT Goal: RH STG PAIN MANAGED AT OR BELOW PT'S PAIN GOAL Description: Less than 3 out of 10 Outcome: Progressing   Problem: RH KNOWLEDGE DEFICIT GENERAL Goal: RH STG INCREASE KNOWLEDGE OF SELF CARE AFTER HOSPITALIZATION Description: James [name] will be able to manage care independently using handouts provided by staff with mod I assist   Outcome: Progressing

## 2019-09-28 ENCOUNTER — Inpatient Hospital Stay (HOSPITAL_COMMUNITY): Payer: Medicaid Other | Admitting: Occupational Therapy

## 2019-09-28 ENCOUNTER — Inpatient Hospital Stay (HOSPITAL_COMMUNITY): Payer: Medicaid Other | Admitting: Physical Therapy
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
## 2019-09-28 NOTE — Progress Notes (Signed)
Occupational Therapy Session Note  Patient Details  Name: James [name] MRN: [id number] Date of Birth: 01-31-1980  Today's Date: 09/28/2019 OT Individual Time: 0950-1100 OT Individual Time Calculation (min): 70 min    Short Term Goals: Week 1:  OT Short Term Goal 1 (Week 1): STG= LTG d/t ELOS  Skilled Therapeutic Interventions/Progress Updates:    Pt greeted in bed with no c/o pain, requesting to shower. OT covered Lt UE/hip incisions. Consulted PA about covering incisions in shower at home with PA consenting to bathing with incisions uncovered at home. Relayed this education to pt. Pt able to direct care for setting up his w/c and verbalize his precautions. Pt able to exhibit carryover of his precautions throughout session, bathing while seated on TTB and also when dressing sit<stand w/c level at sink afterwards. He used the Lt hand minimally to use the sock aide but only used his Rt hand when pulling sock over foot. Good adherence to NWB/non abducting of the Lt UE noted. One handed techniques utilized for grooming tasks and also donning his arm sling. Setup-Mod I overall. At end of session pt returned to bed via stand pivot without AD at Mod I level. Left him in bed with all needs within reach and bed alarm set.   Family was not present for family education today but pt feels comfortable directing care to family at d/c and exhibits good awareness of his precautions, states he is able to direct care for his Lt UE PROM program as well.    Therapy Documentation Precautions:  Precautions Precautions: Fall, Posterior Hip Precaution Booklet Issued: No Precaution Comments: reviewed precautions pt able to recall 3/3 Required Braces or Orthoses: Sling Restrictions Weight Bearing Restrictions: Yes LUE Weight Bearing: Non weight bearing LLE Weight Bearing: Touchdown weight bearing Other Position/Activity Restrictions: post hip precautions; no L shoulder active abduction Pain: Pain  Assessment Pain Scale: 0-10 Pain Score: 0-No pain ADL: ADL Equipment Provided: Reacher, Long-handled sponge, Sock aid Eating: Set up Where Assessed-Eating: Chair Grooming: Modified independent Where Assessed-Grooming: Sitting at sink Upper Body Bathing: Modified independent Where Assessed-Upper Body Bathing: Shower Lower Body Bathing: Supervision/safety Where Assessed-Lower Body Bathing: Shower Upper Body Dressing: Modified independent (Device) Where Assessed-Upper Body Dressing: Wheelchair Lower Body Dressing: Supervision/safety Toileting: Supervision/safety Where Assessed-Toileting: Glass blower/designer: Close supervision Toilet Transfer Method: Stand pivot Tub/Shower Transfer: Close supervison Clinical cytogeneticist Method: Librarian, academic: Facilities manager: Close supervision Social research officer, government Method: Radiographer, therapeutic: Radio broadcast assistant      Therapy/Group: Individual Therapy  [name] 09/28/2019, 12:29 PM

## 2019-09-28 NOTE — Progress Notes (Signed)
[facility] well as [facility]--their policy does not allow them to fill methadone for substance abuse issues. Discussed options with patient and LCSW --patient unhappy but safer to keep him till Monday for dose then d/c with follow up in clinic on Tuesday. Pauline Good of clinic in [place] updated regarding above.

## 2019-09-28 NOTE — Progress Notes (Signed)
Physical Therapy Session Note  Patient Details  Name: James [name] MRN: [id number] Date of Birth: [birth year]  Today's Date: 09/28/2019 PT Individual Time: 5956-3875 PT Individual Time Calculation (min): 68 min   Short Term Goals: Week 1:  PT Short Term Goal 1 (Week 1): =LTG due to ELOS  Skilled Therapeutic Interventions/Progress Updates:    Pt received supine in bed and agreeable to therapy session. Supine>sitting R EOB, mod-I. Sitting EOB donned L UE sling mod-I. L Stand pivot EOB>w/c, no AD, using R UE support on arm rest mod-I. R hemi-technique w/c propulsion to therapy gym mod-I. Stand pivot w/c<>EOM mod-I - therapist educated pt on set-up of transfer and positioning of w/c to allow pt to then transfer back to w/c without requiring assistance of another person - pt demonstrated understanding. Supine<>sit on mat mod-I. Performed the following exercises on bilateral LEs except where specified 2x15 reps each:  - supine hip flexor stretch 35mnute - supine ankle PF/DF x20 reps - supine heel slides  - supine straight leg raise - supine hip abduction  - R LE one leg bridge Therapist cuing throughout as needed for proper form/technique and educated pt on importance of continuing to perform these daily as written on HEP. R hemi-technique w/c propulsion back to room mod-I. R stand pivot to EOB mod-I with pt demoing proper positioning/safety of w/c without assist or cuing. Supine L UE PROM into shoulder abduction to 90degrees, shoulder internal rotation to stomach and external rotation to neutral (90degrees/forearm straight up towards ceiling), and shoulder flexion in scapular plan ~45degrees - pt reports most tightness noted into shoulder flexion but otherwise denies pain. Provided ice to shoulder at end of session for pain management. Pt denies any questions/concerns regarding discharge plan. Pt left supine in bed with needs in reach.  Pt's family unable to come today for education/training but  pt feels comfortable directing care to family at home and pt exhibits good awareness of his precautions.    Therapy Documentation Precautions:  Precautions Precautions: Fall, Posterior Hip Precaution Booklet Issued: No Precaution Comments: reviewed precautions pt able to recall 3/3 Required Braces or Orthoses: Sling Restrictions Weight Bearing Restrictions: Yes LUE Weight Bearing: Non weight bearing LLE Weight Bearing: Touchdown weight bearing Other Position/Activity Restrictions: post hip precautions; no L shoulder active abduction  Pain:  Continues to report "stretching" pain in L calf during exercises - educated pt on keeping movement in "slight" stretch ROM. At beginning of session reports 7-8/10 pain in L hip, pt using ice, this does not limit participation in therapy session - provided rest breaks as needed for pain management.   Therapy/Group: Individual Therapy  CTawana Scale, PT, DPT, CSRS  09/28/2019, 3:37 PM

## 2019-09-28 NOTE — Progress Notes (Signed)
Physical Therapy Session Note  Patient Details  Name: James [name] MRN: [id number] Date of Birth: [birth year]  Today's Date: 09/28/2019 PT Individual Time:  -      Short Term Goals: Week 1:  PT Short Term Goal 1 (Week 1): =LTG due to ELOS  Skilled Therapeutic Interventions/Progress Updates: Pt presents in supine sleeping.  Pt arouses and states increased pain L hip area and not sleeping well last night.  Spoke w/ nursing re: pain meds and will check.  PT states will come back to resume therapy session.  PT returned 20 minutes later, but pt states not able to participate at this time.  Any questions answered at this time.  Pt remains in bed w/ bed alarm on and all needs in reach.     Therapy Documentation Precautions:  Precautions Precautions: Fall, Posterior Hip Precaution Booklet Issued: No Precaution Comments: reviewed precautions pt able to recall 3/3 Required Braces or Orthoses: Sling Restrictions Weight Bearing Restrictions: Yes LUE Weight Bearing: Non weight bearing LLE Weight Bearing: Touchdown weight bearing Other Position/Activity Restrictions: post hip precautions; no L shoulder active abduction General: PT Amount of Missed Time (min): 60 Minutes PT Missed Treatment Reason: Pain;Patient fatigue (pt did not sleep well last night) Vital Signs:   Pain: about a 9.   Mobility:   Locomotion :    Trunk/Postural Assessment :    Balance:   Exercises:   Other Treatments:      Therapy/Group: Individual Therapy  Ladoris Gene 09/28/2019, 8:30 AM

## 2019-09-28 NOTE — Progress Notes (Signed)
Patient ID: James [name], male   DOB: 1980-11-17, 39 y.o.   MRN: [id number]  Met with pt who reports he can't work out the [facility] on Sunday, will need to wait until Monday and go to [place] clinic on Tuesday. He will need to stick with either [facility] or the [facility]. Will let Pam-PA and MD aware of his plan. Therapy team aware of this plan.

## 2019-09-28 NOTE — Progress Notes (Signed)
Physical Therapy Discharge Summary  Patient Details  Name: James [name] MRN: [id number] Date of Birth: Nov 06, 1980  Patient has met 6 of 6 long term goals due to improved activity tolerance, improved balance, improved postural control, increased strength, increased range of motion, decreased pain, ability to compensate for deficits, improved attention, improved awareness and improved coordination.  Patient to discharge at a wheelchair level Modified Independent.   Patient's care partner unable to attend hands on education/training but pt reports feeling comfortable directing his care and exhibits good awareness of his precautions.   All goals met.  Recommendation:  Patient will benefit from ongoing skilled PT services in home health setting to continue to advance safe functional mobility, address ongoing impairments in activity tolerance, L UE progression after RTC injury, and minimize fall risk.  Equipment: provided pt with 18x18 wheelchair  Reasons for discharge: treatment goals met and discharge from hospital  Patient/family agrees with progress made and goals achieved: Yes   PT Discharge Precautions/Restrictions Precautions Precautions: Fall;Posterior Hip Precaution Booklet Issued: No Required Braces or Orthoses: Sling (L UE) Restrictions Weight Bearing Restrictions: Yes LUE Weight Bearing: Non weight bearing LLE Weight Bearing: Touchdown weight bearing Other Position/Activity Restrictions: L LE posterior hip precautions; no L shoulder active abduction Pain Pain Assessment Pain Scale: 0-10 Pain Score: 7  Pain Type: Surgical pain;Acute pain Pain Location: Hip Pain Orientation: Left Pain Descriptors / Indicators: Aching;Discomfort Pain Frequency: Constant Pain Onset: On-going Patients Stated Pain Goal: 2 Pain Intervention(s): Rest;Relaxation;Repositioned;Cold applied Multiple Pain Sites: No Perception  Perception Perception: Within Functional Limits Praxis Praxis:  Intact  Cognition Overall Cognitive Status: Within Functional Limits for tasks assessed Arousal/Alertness: Awake/alert Orientation Level: Oriented X4 Attention: Focused;Sustained;Selective Focused Attention: Appears intact Sustained Attention: Appears intact Selective Attention: Appears intact Memory: Appears intact Awareness: Appears intact Problem Solving: Appears intact Safety/Judgment: Appears intact Sensation Sensation Light Touch: Impaired Detail Light Touch Impaired Details: Impaired RLE;Impaired LLE (impaired distally>proximally since back sx Jan 2021) Hot/Cold: Not tested Proprioception: Appears Intact Stereognosis: Not tested Coordination Gross Motor Movements are Fluid and Coordinated: Yes Coordination and Movement Description: GM coordination WFL for current mobility level with WBing precautions Motor  Motor Motor: Abnormal postural alignment and control Motor - Discharge Observations: impaired 2/2 multi trauma but improved since eval, improved ability to compensate for deficits  Mobility Bed Mobility Bed Mobility: Supine to Sit;Sit to Supine Supine to Sit: Independent Sit to Supine: Independent Transfers Transfers: Sit to Stand;Stand to [employer] Transfers Sit to Stand: Independent Stand to Sit: Independent Stand Pivot Transfers: Independent Transfer (Assistive device): Other (Comment) (uses armrests for support on w/c) Locomotion  Gait Ambulation: No Gait Gait: No Stairs / Additional Locomotion Stairs: No Wheelchair Mobility Wheelchair Mobility: Yes Wheelchair Assistance: Independent with Camera operator: Right upper extremity;Right lower extremity Wheelchair Parts Management: Independent Distance: >270f  Trunk/Postural Assessment  Cervical Assessment Cervical Assessment: Within Functional Limits Thoracic Assessment Thoracic Assessment: Exceptions to [facility](thoracic rounding, scapular protcation) Lumbar  Assessment Lumbar Assessment: Within Functional Limits Postural Control Postural Control: Within Functional Limits  Balance Balance Balance Assessed: Yes Static Sitting Balance Static Sitting - Balance Support: No upper extremity supported Static Sitting - Level of Assistance: 6: Modified independent (Device/Increase time) Dynamic Sitting Balance Dynamic Sitting - Balance Support: No upper extremity supported Dynamic Sitting - Level of Assistance: 6: Modified independent (Device/Increase time) Static Standing Balance Static Standing - Balance Support: Right upper extremity supported Static Standing - Level of Assistance: 6: Modified independent (Device/Increase time) Dynamic Standing Balance Dynamic Standing -  Balance Support: Right upper extremity supported;During functional activity Dynamic Standing - Level of Assistance: 6: Modified independent (Device/Increase time) Extremity Assessment      RLE Assessment RLE Assessment: Within Functional Limits General Strength Comments: 5/5 grossly LLE Assessment LLE Assessment: Exceptions to [facility] Active Range of Motion (AROM) Comments: WFL General Strength Comments: demos ability to move against gravity but no resistance applied due to precautions LLE Strength Left Hip Flexion: 3/5 Left Knee Flexion: 3/5 Left Knee Extension: 3/5 Left Ankle Dorsiflexion: 3/5 Left Ankle Plantar Flexion: 3/5    Tawana Scale , PT, DPT, CSRS  09/28/2019, 12:22 PM

## 2019-09-28 NOTE — Plan of Care (Signed)
  Problem: Sit to Stand Goal: LTG:  Patient will perform sit to stand in prep for activites of daily living with assistance level (OT) Description: LTG:  Patient will perform sit to stand in prep for activites of daily living with assistance level (OT) Outcome: Completed/Met   Problem: RH Bathing Goal: LTG Patient will bathe all body parts with assist levels (OT) Description: LTG: Patient will bathe all body parts with assist levels (OT) Outcome: Completed/Met   Problem: RH Dressing Goal: LTG Patient will perform upper body dressing (OT) Description: LTG Patient will perform upper body dressing with assist, with/without cues (OT). Outcome: Completed/Met Goal: LTG Patient will perform lower body dressing w/assist (OT) Description: LTG: Patient will perform lower body dressing with assist, with/without cues in positioning using equipment (OT) Outcome: Completed/Met   Problem: RH Toileting Goal: LTG Patient will perform toileting task (3/3 steps) with assistance level (OT) Description: LTG: Patient will perform toileting task (3/3 steps) with assistance level (OT)  Outcome: Completed/Met   Problem: RH Tub/Shower Transfers Goal: LTG Patient will perform tub/shower transfers w/assist (OT) Description: LTG: Patient will perform tub/shower transfers with assist, with/without cues using equipment (OT) Outcome: Completed/Met

## 2019-09-28 NOTE — Progress Notes (Signed)
Inpatient Rehabilitation Care Coordinator  Discharge Note  The overall goal for the admission was met for:   Discharge location: Yes-HOME WITH STEP-MOM AND DAD-WHO IS STILL IN [place] [facility]  Length of Stay: Yes - 9 DAYS  Discharge activity level: Yes-MOD/I WHEELCHAIR LEVEL  Home/community participation: Yes  Services provided included: MD, RD, PT, OT, RN, CM, Pharmacy, Neuropsych and SW  Financial Services: Private Insurance: MED=PAY AND FAMILY PLANNING MEDICAID  Follow-up services arranged: Other: PATIENT OREDERED WHEELCHAIR ON-LINE AND WILL BORROW OTHER PIECES OF EQUIPMENT FROM UNCLE . THERAPISTS GAVE HOME EXERCISE PROGRAM FOR HOME SINCE NO HOME HEALTH AGENCY WILL TAKE MED-PAY AND FAMILY PLANNING DOESN'T COVER. MATCH GIVEN TO PT FOR PRESCRIPTIONS ASSISTANCE AND FLLED  AT TOC PHARMARCY  Comments (or additional information):PT DID WELL AND REACHED HIS GOALS. HE HAS WORKED OUT WITH [facility] AND WILL BE THERE Tuesday TO SEE NP. Step-mom to come after work to transport pt home  Patient/Family verbalized understanding of follow-up arrangements: Yes  Individual responsible for coordination of the follow-up plan: [phone]  Confirmed correct DME delivered: Elease Hashimoto 09/28/2019    Elease Hashimoto

## 2019-09-28 NOTE — Plan of Care (Signed)
  Problem: Consults Goal: RH GENERAL PATIENT EDUCATION Description: See Patient Education module for education specifics. Outcome: Progressing   Problem: RH SKIN INTEGRITY Goal: RH STG SKIN FREE OF INFECTION/BREAKDOWN Description: No skin breakdown this admission with cues Outcome: Progressing Goal: RH STG MAINTAIN SKIN INTEGRITY WITH ASSISTANCE Description: STG Maintain Skin Integrity With mod I Assistance. Outcome: Progressing Goal: RH STG ABLE TO PERFORM INCISION/WOUND CARE W/ASSISTANCE Description: STG Able To Perform Incision/Wound Care With mod I Assistance. Outcome: Progressing   Problem: RH SAFETY Goal: RH STG ADHERE TO SAFETY PRECAUTIONS W/ASSISTANCE/DEVICE Description: STG Adhere to Safety Precautions With min Assistance/Device. Outcome: Progressing   Problem: RH PAIN MANAGEMENT Goal: RH STG PAIN MANAGED AT OR BELOW PT'S PAIN GOAL Description: Less than 3 out of 10 Outcome: Progressing   Problem: RH KNOWLEDGE DEFICIT GENERAL Goal: RH STG INCREASE KNOWLEDGE OF SELF CARE AFTER HOSPITALIZATION Description: Allayne Stack will be able to manage care independently using handouts provided by staff with mod I assist   Outcome: Progressing

## 2019-09-29 DIAGNOSIS — G8918 Other acute postprocedural pain: Secondary | ICD-10-CM
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
## 2019-09-29 NOTE — Plan of Care (Signed)
  Problem: Consults Goal: RH GENERAL PATIENT EDUCATION Description: See Patient Education module for education specifics. Outcome: Progressing   Problem: RH SKIN INTEGRITY Goal: RH STG SKIN FREE OF INFECTION/BREAKDOWN Description: No skin breakdown this admission with cues Outcome: Progressing Goal: RH STG MAINTAIN SKIN INTEGRITY WITH ASSISTANCE Description: STG Maintain Skin Integrity With mod I Assistance. Outcome: Progressing Goal: RH STG ABLE TO PERFORM INCISION/WOUND CARE W/ASSISTANCE Description: STG Able To Perform Incision/Wound Care With mod I Assistance. Outcome: Progressing   Problem: RH SAFETY Goal: RH STG ADHERE TO SAFETY PRECAUTIONS W/ASSISTANCE/DEVICE Description: STG Adhere to Safety Precautions With min Assistance/Device. Outcome: Progressing   Problem: RH PAIN MANAGEMENT Goal: RH STG PAIN MANAGED AT OR BELOW PT'S PAIN GOAL Description: Less than 3 out of 10 Outcome: Progressing   Problem: RH KNOWLEDGE DEFICIT GENERAL Goal: RH STG INCREASE KNOWLEDGE OF SELF CARE AFTER HOSPITALIZATION Description: James [name] will be able to manage care independently using handouts provided by staff with mod I assist   Outcome: Progressing

## 2019-09-30 NOTE — Plan of Care (Signed)
  Problem: Consults Goal: RH GENERAL PATIENT EDUCATION Description: See Patient Education module for education specifics. Outcome: Progressing   Problem: RH SKIN INTEGRITY Goal: RH STG SKIN FREE OF INFECTION/BREAKDOWN Description: No skin breakdown this admission with cues Outcome: Progressing Goal: RH STG MAINTAIN SKIN INTEGRITY WITH ASSISTANCE Description: STG Maintain Skin Integrity With mod I Assistance. Outcome: Progressing Goal: RH STG ABLE TO PERFORM INCISION/WOUND CARE W/ASSISTANCE Description: STG Able To Perform Incision/Wound Care With mod I Assistance. Outcome: Progressing   Problem: RH SAFETY Goal: RH STG ADHERE TO SAFETY PRECAUTIONS W/ASSISTANCE/DEVICE Description: STG Adhere to Safety Precautions With min Assistance/Device. Outcome: Progressing   Problem: RH PAIN MANAGEMENT Goal: RH STG PAIN MANAGED AT OR BELOW PT'S PAIN GOAL Description: Less than 3 out of 10 Outcome: Progressing   Problem: RH KNOWLEDGE DEFICIT GENERAL Goal: RH STG INCREASE KNOWLEDGE OF SELF CARE AFTER HOSPITALIZATION Description: James [name] will be able to manage care independently using handouts provided by staff with mod I assist   Outcome: Progressing

## 2019-10-01 DIAGNOSIS — T8131XA Disruption of external operation (surgical) wound, not elsewhere classified, initial encounter: Secondary | ICD-10-CM

## 2019-10-01 DIAGNOSIS — D62 Acute posthemorrhagic anemia: Secondary | ICD-10-CM

## 2019-10-01 LAB — BASIC METABOLIC PANEL
Anion gap: 8 (ref 5–15)
BUN: 12 mg/dL (ref 6–20)
CO2: 25 mmol/L (ref 22–32)
Calcium: 9 mg/dL (ref 8.9–10.3)
Chloride: 105 mmol/L (ref 98–111)
Creatinine, Ser: 0.74 mg/dL (ref 0.61–1.24)
GFR calc Af Amer: >60 mL/min (ref >60)
GFR calc non Af Amer: >60 mL/min (ref >60)
Glucose, Bld: 115 mg/dL — ABNORMAL HIGH (ref 70–99)
Potassium: 4 mmol/L (ref 3.5–5.1)
Sodium: 138 mmol/L (ref 135–145)

## 2019-10-01 MED ORDER — CYCLOBENZAPRINE HCL 7.5 MG PO TABS: 7.5000 mg | ORAL_TABLET | Freq: Three times a day (TID) | ORAL | 0 refills | Status: AC

## 2019-10-01 MED ORDER — GABAPENTIN 300 MG PO CAPS: 300.0000 mg | ORAL_CAPSULE | Freq: Two times a day (BID) | ORAL | 0 refills | Status: AC

## 2019-10-01 MED ORDER — NALOXEGOL OXALATE 12.5 MG PO TABS: 12.5000 mg | ORAL_TABLET | Freq: Every day | ORAL | 0 refills | Status: AC

## 2019-10-01 MED ORDER — DOXYCYCLINE HYCLATE 100 MG PO TABS
100.0000 mg | ORAL_TABLET | Freq: Two times a day (BID) | ORAL | Status: DC
  Administered 2019-10-01: 100 mg via ORAL

## 2019-10-01 MED ORDER — TAMSULOSIN HCL 0.4 MG PO CAPS: 0.4000 mg | ORAL_CAPSULE | Freq: Every day | ORAL | 0 refills | Status: AC

## 2019-10-01 MED ORDER — APIXABAN 2.5 MG PO TABS: 2.5000 mg | ORAL_TABLET | Freq: Two times a day (BID) | ORAL | 0 refills | Status: AC

## 2019-10-01 MED ORDER — TRAMADOL HCL 50 MG PO TABS
50.0000 mg | ORAL_TABLET | Freq: Four times a day (QID) | ORAL | 0 refills | Status: AC | PRN
Start: 2019-10-01 — End: ?

## 2019-10-01 MED ORDER — DOXYCYCLINE HYCLATE 100 MG PO TABS: 100.0000 mg | ORAL_TABLET | Freq: Two times a day (BID) | ORAL | 0 refills | Status: AC

## 2019-10-01 MED ORDER — POLYETHYLENE GLYCOL 3350 17 G PO PACK: 17.0000 g | PACK | Freq: Every day | ORAL | 0 refills | Status: AC

## 2019-10-01 MED ORDER — MELOXICAM 15 MG PO TABS: 15.0000 mg | ORAL_TABLET | Freq: Every day | ORAL | 0 refills | Status: AC

## 2019-10-01 MED FILL — TAMSULOSIN HCL 0.4 MG CAP: 0.4 | 30 days supply | Qty: 30 | Fill #0

## 2019-10-01 MED FILL — GABAPENTIN 300 MG CAPSULE: 300 | 30 days supply | Qty: 60 | Fill #0

## 2019-10-01 MED FILL — MOVANTIK 12.5 MG TABS: 12.5 | 7 days supply | Qty: 7 | Fill #0

## 2019-10-01 MED FILL — DOXYCYCLINE HYCLATE 100 MG: 100 | 13 days supply | Qty: 27 | Fill #0

## 2019-10-01 MED FILL — CYCLOBENZAPRINE HCL 5 MG TA: 5 | 30 days supply | Qty: 135 | Fill #0

## 2019-10-01 MED FILL — MELOXICAM 15 MG TABLET: 15 | 30 days supply | Qty: 30 | Fill #0

## 2019-10-01 MED FILL — traMADol HCL 50 MG TABS: 50 | 7 days supply | Qty: 28 | Fill #0

## 2019-10-01 MED FILL — ELIQUIS 2.5 MG TABLET: 2.5 | 30 days supply | Qty: 60 | Fill #0

## 2019-10-01 MED FILL — POLYETHYLENE GLYCOL 3350 PO: 17 | 30 days supply | Qty: 510 | Fill #0
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
## 2019-10-01 NOTE — Progress Notes (Signed)
Patient ID: James [name], male   DOB: [birth year], 39 y.o.   MRN: [id number] Discharge to home accompanied by sister and step mother. Discharge instructions given to patient by PA earlier with no questions noted. [name]
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
## 2019-11-26 ENCOUNTER — Other Ambulatory Visit: Payer: Self-pay

## 2019-11-26 ENCOUNTER — Encounter: Payer: Self-pay | Admitting: Physical Therapy

## 2019-11-26 ENCOUNTER — Ambulatory Visit: Payer: Self-pay | Attending: Orthopedic Surgery | Admitting: Physical Therapy

## 2019-11-26 DIAGNOSIS — M6281 Muscle weakness (generalized): Secondary | ICD-10-CM | POA: Insufficient documentation

## 2019-11-26 DIAGNOSIS — M25512 Pain in left shoulder: Secondary | ICD-10-CM | POA: Insufficient documentation

## 2019-11-26 DIAGNOSIS — M25552 Pain in left hip: Secondary | ICD-10-CM | POA: Insufficient documentation
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
## 2019-12-04 ENCOUNTER — Other Ambulatory Visit: Payer: Self-pay

## 2019-12-04 ENCOUNTER — Ambulatory Visit: Payer: Self-pay | Admitting: *Deleted

## 2019-12-04 DIAGNOSIS — M25552 Pain in left hip: Secondary | ICD-10-CM

## 2019-12-04 DIAGNOSIS — M25512 Pain in left shoulder: Secondary | ICD-10-CM

## 2019-12-04 DIAGNOSIS — M6281 Muscle weakness (generalized): Secondary | ICD-10-CM
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
## 2019-12-07 ENCOUNTER — Encounter: Payer: Medicaid Other | Admitting: *Deleted

## 2019-12-10 ENCOUNTER — Ambulatory Visit: Payer: Self-pay | Attending: Orthopedic Surgery | Admitting: Physical Therapy

## 2019-12-10 ENCOUNTER — Other Ambulatory Visit: Payer: Self-pay

## 2019-12-10 DIAGNOSIS — M25552 Pain in left hip: Secondary | ICD-10-CM | POA: Insufficient documentation

## 2019-12-10 DIAGNOSIS — M6281 Muscle weakness (generalized): Secondary | ICD-10-CM | POA: Insufficient documentation

## 2019-12-10 DIAGNOSIS — M25512 Pain in left shoulder: Secondary | ICD-10-CM | POA: Insufficient documentation
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
## 2019-12-14 ENCOUNTER — Ambulatory Visit: Payer: Self-pay | Admitting: *Deleted

## 2019-12-14 ENCOUNTER — Other Ambulatory Visit: Payer: Self-pay

## 2019-12-14 DIAGNOSIS — M25512 Pain in left shoulder: Secondary | ICD-10-CM

## 2019-12-14 DIAGNOSIS — M6281 Muscle weakness (generalized): Secondary | ICD-10-CM

## 2019-12-14 DIAGNOSIS — M25552 Pain in left hip: Secondary | ICD-10-CM
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
## 2019-12-18 ENCOUNTER — Encounter: Payer: Self-pay | Admitting: Physical Therapy

## 2019-12-18 ENCOUNTER — Other Ambulatory Visit: Payer: Self-pay

## 2019-12-18 ENCOUNTER — Ambulatory Visit: Payer: Self-pay | Admitting: Physical Therapy

## 2019-12-18 DIAGNOSIS — M25552 Pain in left hip: Secondary | ICD-10-CM

## 2019-12-18 DIAGNOSIS — M6281 Muscle weakness (generalized): Secondary | ICD-10-CM

## 2019-12-18 DIAGNOSIS — M25512 Pain in left shoulder: Secondary | ICD-10-CM
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
## 2019-12-21 ENCOUNTER — Other Ambulatory Visit: Payer: Self-pay

## 2019-12-21 ENCOUNTER — Ambulatory Visit: Payer: Self-pay | Admitting: *Deleted

## 2019-12-21 DIAGNOSIS — M6281 Muscle weakness (generalized): Secondary | ICD-10-CM

## 2019-12-21 DIAGNOSIS — M25552 Pain in left hip: Secondary | ICD-10-CM

## 2019-12-21 DIAGNOSIS — M25512 Pain in left shoulder: Secondary | ICD-10-CM
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
## 2019-12-26 ENCOUNTER — Other Ambulatory Visit: Payer: Self-pay

## 2019-12-26 ENCOUNTER — Ambulatory Visit: Payer: Self-pay | Admitting: *Deleted

## 2019-12-26 DIAGNOSIS — M25512 Pain in left shoulder: Secondary | ICD-10-CM

## 2019-12-26 DIAGNOSIS — M25552 Pain in left hip: Secondary | ICD-10-CM

## 2019-12-26 DIAGNOSIS — M6281 Muscle weakness (generalized): Secondary | ICD-10-CM
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
## 2020-01-01 ENCOUNTER — Ambulatory Visit: Payer: Self-pay | Admitting: *Deleted

## 2020-01-01 ENCOUNTER — Other Ambulatory Visit: Payer: Self-pay

## 2020-01-01 DIAGNOSIS — M25552 Pain in left hip: Secondary | ICD-10-CM

## 2020-01-01 DIAGNOSIS — M6281 Muscle weakness (generalized): Secondary | ICD-10-CM

## 2020-01-01 DIAGNOSIS — M25512 Pain in left shoulder: Secondary | ICD-10-CM
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
## 2020-01-07 ENCOUNTER — Other Ambulatory Visit: Payer: Self-pay

## 2020-01-07 ENCOUNTER — Ambulatory Visit: Payer: Self-pay | Attending: Orthopedic Surgery | Admitting: Physical Therapy

## 2020-01-07 DIAGNOSIS — M6281 Muscle weakness (generalized): Secondary | ICD-10-CM | POA: Insufficient documentation

## 2020-01-07 DIAGNOSIS — M25512 Pain in left shoulder: Secondary | ICD-10-CM | POA: Insufficient documentation

## 2020-01-07 DIAGNOSIS — M25552 Pain in left hip: Secondary | ICD-10-CM | POA: Insufficient documentation
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
## 2020-01-11 ENCOUNTER — Other Ambulatory Visit: Payer: Self-pay

## 2020-01-11 ENCOUNTER — Ambulatory Visit: Payer: Self-pay | Admitting: *Deleted

## 2020-01-11 DIAGNOSIS — M25512 Pain in left shoulder: Secondary | ICD-10-CM

## 2020-01-11 DIAGNOSIS — M6281 Muscle weakness (generalized): Secondary | ICD-10-CM

## 2020-01-11 DIAGNOSIS — M25552 Pain in left hip: Secondary | ICD-10-CM
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
## 2020-01-14 ENCOUNTER — Ambulatory Visit: Payer: Self-pay | Admitting: Physical Therapy

## 2020-01-14 ENCOUNTER — Other Ambulatory Visit: Payer: Self-pay

## 2020-01-14 DIAGNOSIS — M25512 Pain in left shoulder: Secondary | ICD-10-CM

## 2020-01-14 DIAGNOSIS — M6281 Muscle weakness (generalized): Secondary | ICD-10-CM

## 2020-01-14 DIAGNOSIS — M25552 Pain in left hip: Secondary | ICD-10-CM
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
## 2020-01-18 ENCOUNTER — Ambulatory Visit: Payer: Self-pay | Admitting: Physical Therapy

## 2020-01-18 ENCOUNTER — Other Ambulatory Visit: Payer: Self-pay

## 2020-01-18 DIAGNOSIS — M25512 Pain in left shoulder: Secondary | ICD-10-CM

## 2020-01-18 DIAGNOSIS — M6281 Muscle weakness (generalized): Secondary | ICD-10-CM

## 2020-01-18 DIAGNOSIS — M25552 Pain in left hip: Secondary | ICD-10-CM
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
## 2020-01-25 ENCOUNTER — Ambulatory Visit: Payer: Self-pay | Admitting: Physical Therapy

## 2020-01-25 ENCOUNTER — Other Ambulatory Visit: Payer: Self-pay

## 2020-01-25 DIAGNOSIS — M25552 Pain in left hip: Secondary | ICD-10-CM

## 2020-01-25 DIAGNOSIS — M25512 Pain in left shoulder: Secondary | ICD-10-CM

## 2020-01-25 DIAGNOSIS — M6281 Muscle weakness (generalized): Secondary | ICD-10-CM
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
## 2020-01-29 ENCOUNTER — Ambulatory Visit: Payer: Self-pay | Admitting: *Deleted

## 2020-01-29 ENCOUNTER — Other Ambulatory Visit: Payer: Self-pay

## 2020-01-29 DIAGNOSIS — M6281 Muscle weakness (generalized): Secondary | ICD-10-CM

## 2020-01-29 DIAGNOSIS — M25512 Pain in left shoulder: Secondary | ICD-10-CM

## 2020-01-29 DIAGNOSIS — M25552 Pain in left hip: Secondary | ICD-10-CM

## 2020-01-29 NOTE — Therapy (Signed)
[facility] [place], [place], [address] Phone: [phone]   Fax:  [phone]  Physical Therapy Treatment  Patient Details  Name: James [name] MRN: [id number] Date of Birth: [birth year] Referring Provider (PT): Altamese [place] MD.   Encounter Date: 01/29/2020   PT End of Session - 01/29/20 0832    Visit Number 14    Number of Visits 16    Date for PT Re-Evaluation 01/21/20    PT Start Time 0815    PT Stop Time 0905    PT Time Calculation (min) 50 min           Past Medical History:  Diagnosis Date  . Closed fracture of left proximal humerus 09/12/2019  . 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 Hepatitis    Hep C - now being treated x 2 week, another 2 weeks to go per patient  . Hepatitis C 09/12/2019  . Hypertension   . 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 Substance abuse ([place])   . Urinary hesitancy 09/18/2019  . Vitamin D insufficiency 09/14/2019    Past Surgical History:  Procedure Laterality Date  . BACK SURGERY     abscess removal   . HIP CLOSED REDUCTION Left 09/11/2019   Procedure: CLOSED REDUCTION HIP;  Surgeon: Altamese Dan[place], MD;  Location: [place];  Service: Orthopedics;  Laterality: Left;  . INSERTION OF TRACTION PIN Left 09/11/2019   Procedure: INSERTION OF TRACTION PIN;  Surgeon: Altamese Flippin, MD;  Location: [place];  Service: Orthopedics;  Laterality: Left;  . OPEN REDUCTION INTERNAL FIXATION ACETABULUM FRACTURE POSTERIOR Left 09/13/2019   Procedure: OPEN REDUCTION INTERNAL FIXATION ACETABULUM FRACTURE POSTERIOR;  Surgeon: Altamese McGehee, MD;  Location: [place];  Service: Orthopedics;  Laterality: Left;  . ORIF HUMERUS FRACTURE Left 09/16/2019   Procedure: OPEN REDUCTION INTERNAL FIXATION (ORIF) PROXIMAL HUMERUS FRACTURE;  Surgeon: Altamese , MD;  Location: [facility];  Service: Orthopedics;  Laterality: Left;  . THORACIC LAMINECTOMY FOR EPIDURAL ABSCESS N/A 03/13/2018   Procedure: THORACIC LAMINECTOMY  FOR EPIDURAL ABSCESS;  Surgeon: Earnie Larsson, MD;  Location: [facility];  Service: Neurosurgery;  Laterality: N/A;  . wisdom teeth ext      There were no vitals filed for this visit.   Subjective Assessment - 01/29/20 0832    Subjective COVID-19 screen performed prior to patient entering clinic.   Hip is 5/10, shldr 2-3/10    Pertinent History Thoracic surgery and associated LE numbness, Hep C.    Limitations Walking    How long can you walk comfortably? Around house.    Diagnostic tests See under "Imaging" tab.    Patient Stated Goals Get back to pre-accident status.    Currently in Pain? Yes    Pain Score 5     Pain Location Hip    Pain Orientation Left    Pain Descriptors / Indicators Aching    Pain Type Surgical pain    Pain Onset More than a month ago                             [facility] Adult PT Treatment/Exercise - 01/29/20 0001      Exercises   Exercises Knee/Hip;Shoulder      Knee/Hip Exercises: Aerobic   Nustep Level 6 x 15 minutes.      Knee/Hip Exercises: Machines for Strengthening   Cybex Knee Extension 30# x 3 minutes.    Cybex Knee Flexion 40# x  3 minutes.      Shoulder Exercises: ROM/Strengthening   UBE (Upper Arm Bike) 6 minutes at 30 RPM's      Manual Therapy   Manual Therapy Passive ROM    Passive ROM In supine:  PROM/  AAROM  for left shoulder flexion, ER and abduction x 11 minutes with end-range holds                       PT Long Term Goals - 12/10/19 1020      PT LONG TERM GOAL #1   Title Independent with a HEP.    Status On-going      PT LONG TERM GOAL #2   Title Active left shoulder flexion to 155 degrees so the patient can easily reach overhead.    Status On-going      PT LONG TERM GOAL #3   Title Active left shoulder ER to 70 degrees+ to allow for easily donning/doffing of apparel.      PT LONG TERM GOAL #4   Title Increase ROM so patient is able to reach behind back to L3.    Status On-going      PT LONG  TERM GOAL #5   Title Increase left shoulder and hip strength to a solid 4+/5 to increase stability for performance of functional activities.    Status On-going      PT LONG TERM GOAL #6   Title Perform ADL's with pain not > 3/10.    Status On-going      PT LONG TERM GOAL #7   Title Perform a reciprocating stair gait with one railing.    Status On-going      PT LONG TERM GOAL #8   Title Walk in clinic 500 feet without an assistive device with minimal deviation.    Status On-going                 Plan - 01/29/20 0840    Clinical Impression Statement Pt reports doing stretches at home for hip and shldr. Rx focused on strengthening for LT shldr and LT LE f/b passive stretching on LT shldr. Pt reports being able to reach behind his head now with Lt hand. Measure  ROM  next Rx    Personal Factors and Comorbidities Comorbidity 1;Comorbidity 2;Other    Comorbidities Thoracic surgery and associated LE numbness, Hep C.    Examination-Activity Limitations Locomotion Level;Other    Examination-Participation Restrictions Other    Stability/Clinical Decision Making Stable/Uncomplicated    Rehab Potential Good    PT Frequency 2x / week    PT Duration 8 weeks    PT Treatment/Interventions ADLs/Self Care Home Management;Cryotherapy;Electrical Stimulation;Moist Heat;Gait training;Stair training;Functional mobility training;Therapeutic activities;Therapeutic exercise;Balance training;Neuromuscular re-education;Manual techniques;Patient/family education;Passive range of motion    PT Next Visit Plan Continue with POC. x 2 visits           Patient will benefit from skilled therapeutic intervention in order to improve the following deficits and impairments:  Increased fascial restricitons  Visit Diagnosis: Acute pain of left shoulder  Muscle weakness (generalized)  Pain in left hip     Problem List Patient Active Problem List   Diagnosis Date Noted  . Wound dehiscence, surgical  10/01/2019  . Acute blood loss anemia 10/01/2019  . Multiple traumatic injuries 09/22/2019  . Urinary hesitancy 09/18/2019  . Vitamin D insufficiency 09/14/2019  . Hepatitis C 09/12/2019  . 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 Closed fracture of left  proximal humerus 09/12/2019  . Acetabulum fracture, left ([place]) 09/11/2019  . Low serum vitamin B12 04/09/2018  . Obesity 04/09/2018  . IVDU (intravenous drug user) 03/14/2018  . Essential hypertension 03/14/2018  . 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    Ameena Vesey,CHRIS, PTA 01/29/2020, 9:16 AM  [facility] [place], [place], [address] Phone: [phone]   Fax:  [phone]  Name: James [name] MRN: [id number] Date of Birth: 04/07/1980

## 2020-01-30 NOTE — Progress Notes (Signed)
  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   Plan: The patient will follow-up with orthopedic surgery. -----------------------------------  Lonie Peak, MD

## 2020-02-01 ENCOUNTER — Ambulatory Visit: Payer: Self-pay | Admitting: Physical Therapy

## 2020-02-01 ENCOUNTER — Other Ambulatory Visit: Payer: Self-pay

## 2020-02-01 DIAGNOSIS — M25552 Pain in left hip: Secondary | ICD-10-CM

## 2020-02-01 DIAGNOSIS — M6281 Muscle weakness (generalized): Secondary | ICD-10-CM

## 2020-02-01 DIAGNOSIS — M25512 Pain in left shoulder: Secondary | ICD-10-CM

## 2020-02-01 NOTE — Therapy (Signed)
[facility] [place], [place], [address] Phone: [phone]   Fax:  [phone]  Physical Therapy Treatment  Patient Details  Name: Jamarl Pew MRN: [id number] Date of Birth: 10-26-1980 Referring Provider (PT): Altamese Artemus MD.   Encounter Date: 02/01/2020   PT End of Session - 02/01/20 1059    Visit Number 15    Number of Visits 16    Date for PT Re-Evaluation 01/21/20    PT Start Time 0816    PT Stop Time 0900    PT Time Calculation (min) 44 min           Past Medical History:  Diagnosis Date  . Closed fracture of left proximal humerus 09/12/2019  . 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 Hepatitis    Hep C - now being treated x 2 week, another 2 weeks to go per patient  . Hepatitis C 09/12/2019  . Hypertension   . 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 Substance abuse ([place])   . Urinary hesitancy 09/18/2019  . Vitamin D insufficiency 09/14/2019    Past Surgical History:  Procedure Laterality Date  . BACK SURGERY     abscess removal   . HIP CLOSED REDUCTION Left 09/11/2019   Procedure: CLOSED REDUCTION HIP;  Surgeon: Altamese Beckett, MD;  Location: [facility];  Service: Orthopedics;  Laterality: Left;  . INSERTION OF TRACTION PIN Left 09/11/2019   Procedure: INSERTION OF TRACTION PIN;  Surgeon: Altamese Milton, MD;  Location: [facility];  Service: Orthopedics;  Laterality: Left;  . OPEN REDUCTION INTERNAL FIXATION ACETABULUM FRACTURE POSTERIOR Left 09/13/2019   Procedure: OPEN REDUCTION INTERNAL FIXATION ACETABULUM FRACTURE POSTERIOR;  Surgeon: Altamese [place]one, MD;  Location: [facility];  Service: Orthopedics;  Laterality: Left;  . ORIF HUMERUS FRACTURE Left 09/16/2019   Procedure: OPEN REDUCTION INTERNAL FIXATION (ORIF) PROXIMAL HUMERUS FRACTURE;  Surgeon: Altamese Storla, MD;  Location: [facility] [place];  Service: Orthopedics;  Laterality: Left;  . THORACIC LAMINECTOMY FOR EPIDURAL ABSCESS N/A 03/13/2018   Procedure: THORACIC LAMINECTOMY  FOR EPIDURAL ABSCESS;  Surgeon: Earnie Larsson, MD;  Location: [facility];  Service: Neurosurgery;  Laterality: N/A;  . wisdom teeth ext      There were no vitals filed for this visit.   Subjective Assessment - 02/01/20 0906    Subjective COVID-19 screen performed prior to patient entering clinic.  Fell on right hand yesterday.    Pertinent History Thoracic surgery and associated LE numbness, Hep C.    Limitations Walking    How long can you walk comfortably? Around house.    Diagnostic tests See under "Imaging" tab.    Patient Stated Goals Get back to pre-accident status.    Currently in Pain? Yes    Pain Score 5     Pain Location Hip    Pain Orientation Left    Pain Descriptors / Indicators Aching    Pain Type Surgical pain    Pain Onset More than a month ago                             [facility] Adult PT Treatment/Exercise - 02/01/20 0001      Exercises   Exercises Knee/Hip;Shoulder      Knee/Hip Exercises: Aerobic   Nustep level 6 x 15 minutes.      Knee/Hip Exercises: Machines for Strengthening   Cybex Knee Extension 30# x 3 minutes.    Cybex Knee Flexion 50# x 3  minutes.      Shoulder Exercises: ROM/Strengthening   UBE (Upper Arm Bike) 6 minutes at 30 RPM's.      Manual Therapy   Manual Therapy Passive ROM    Passive ROM In supine:  PROM to patient's left shoulder x 11 minutes into flexion, ER and abduction.                       PT Long Term Goals - 12/10/19 1020      PT LONG TERM GOAL #1   Title Independent with a HEP.    Status On-going      PT LONG TERM GOAL #2   Title Active left shoulder flexion to 155 degrees so the patient can easily reach overhead.    Status On-going      PT LONG TERM GOAL #3   Title Active left shoulder ER to 70 degrees+ to allow for easily donning/doffing of apparel.      PT LONG TERM GOAL #4   Title Increase ROM so patient is able to reach behind back to L3.    Status On-going      PT LONG TERM GOAL #5    Title Increase left shoulder and hip strength to a solid 4+/5 to increase stability for performance of functional activities.    Status On-going      PT LONG TERM GOAL #6   Title Perform ADL's with pain not > 3/10.    Status On-going      PT LONG TERM GOAL #7   Title Perform a reciprocating stair gait with one railing.    Status On-going      PT LONG TERM GOAL #8   Title Walk in clinic 500 feet without an assistive device with minimal deviation.    Status On-going                 Plan - 02/01/20 1100    Clinical Impression Statement Patient reports falling yesterday on an outstretched right UE.  Reports no increase in pain.  He did well with a 10# increase in ham curls today.    Personal Factors and Comorbidities Comorbidity 1;Comorbidity 2;Other    Comorbidities Thoracic surgery and associated LE numbness, Hep C.    Examination-Activity Limitations Locomotion Level;Other    Examination-Participation Restrictions Other    Stability/Clinical Decision Making Stable/Uncomplicated    Rehab Potential Good    PT Frequency 2x / week    PT Duration 8 weeks    PT Treatment/Interventions ADLs/Self Care Home Management;Cryotherapy;Electrical Stimulation;Moist Heat;Gait training;Stair training;Functional mobility training;Therapeutic activities;Therapeutic exercise;Balance training;Neuromuscular re-education;Manual techniques;Patient/family education;Passive range of motion    PT Next Visit Plan --    Consulted and Agree with Plan of Care Patient           Patient will benefit from skilled therapeutic intervention in order to improve the following deficits and impairments:  Increased fascial restricitons  Visit Diagnosis: Acute pain of left shoulder  Muscle weakness (generalized)  Pain in left hip     Problem List Patient Active Problem List   Diagnosis Date Noted  . Wound dehiscence, surgical 10/01/2019  . Acute blood loss anemia 10/01/2019  . Multiple traumatic  injuries 09/22/2019  . Urinary hesitancy 09/18/2019  . Vitamin D insufficiency 09/14/2019  . Hepatitis C 09/12/2019  . 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 Closed fracture of left proximal humerus 09/12/2019  . Acetabulum fracture, left ([place]) 09/11/2019  . Low serum vitamin B12 04/09/2018  .  Obesity 04/09/2018  . IVDU (intravenous drug user) 03/14/2018  . Essential hypertension 03/14/2018  . 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    Virgilio Broadhead, [place] MPT 02/01/2020, 11:03 AM  [facility] [place], [place], [address] Phone: [phone]   Fax:  [phone]  Name: Trygve Thal MRN: [id number] Date of Birth: 1980-10-25

## 2020-02-04 ENCOUNTER — Other Ambulatory Visit: Payer: Self-pay

## 2020-02-04 ENCOUNTER — Ambulatory Visit: Payer: Self-pay | Admitting: Physical Therapy

## 2020-02-04 DIAGNOSIS — M6281 Muscle weakness (generalized): Secondary | ICD-10-CM

## 2020-02-04 DIAGNOSIS — M25512 Pain in left shoulder: Secondary | ICD-10-CM

## 2020-02-04 DIAGNOSIS — M25552 Pain in left hip: Secondary | ICD-10-CM

## 2020-02-04 NOTE — Therapy (Signed)
[facility] [place], [place], [address] Phone: [phone]   Fax:  [phone]  Physical Therapy Treatment  Patient Details  Name: James [name] MRN: [id number] Date of Birth: 1980/05/12 Referring Provider (PT): Altamese [place] MD.   Encounter Date: 02/04/2020   PT End of Session - 02/04/20 0929    Visit Number 16    Number of Visits 20    Date for PT Re-Evaluation 02/25/20    PT Start Time 0907   Late arrival.   PT Stop Time 0945    PT Time Calculation (min) 38 min    Activity Tolerance Patient tolerated treatment well    Behavior During Therapy [facility] for tasks assessed/performed           Past Medical History:  Diagnosis Date  . Closed fracture of left proximal humerus 09/12/2019  . 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 Hepatitis    Hep C - now being treated x 2 week, another 2 weeks to go per patient  . Hepatitis C 09/12/2019  . Hypertension   . 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 Substance abuse ([place])   . Urinary hesitancy 09/18/2019  . Vitamin D insufficiency 09/14/2019    Past Surgical History:  Procedure Laterality Date  . BACK SURGERY     abscess removal   . HIP CLOSED REDUCTION Left 09/11/2019   Procedure: CLOSED REDUCTION HIP;  Surgeon: Altamese [place], MD;  Location: [facility];  Service: Orthopedics;  Laterality: Left;  . INSERTION OF TRACTION PIN Left 09/11/2019   Procedure: INSERTION OF TRACTION PIN;  Surgeon: Altamese [place], MD;  Location: [facility];  Service: Orthopedics;  Laterality: Left;  . OPEN REDUCTION INTERNAL FIXATION ACETABULUM FRACTURE POSTERIOR Left 09/13/2019   Procedure: OPEN REDUCTION INTERNAL FIXATION ACETABULUM FRACTURE POSTERIOR;  Surgeon: Altamese [place], MD;  Location: [place];  Service: Orthopedics;  Laterality: Left;  . ORIF HUMERUS FRACTURE Left 09/16/2019   Procedure: OPEN REDUCTION INTERNAL FIXATION (ORIF) PROXIMAL HUMERUS FRACTURE;  Surgeon: Altamese [place], MD;  Location: [facility];   Service: Orthopedics;  Laterality: Left;  . THORACIC LAMINECTOMY FOR EPIDURAL ABSCESS N/A 03/13/2018   Procedure: THORACIC LAMINECTOMY FOR EPIDURAL ABSCESS;  Surgeon: Earnie Larsson, MD;  Location: [facility] [place];  Service: Neurosurgery;  Laterality: N/A;  . wisdom teeth ext      There were no vitals filed for this visit.   Subjective Assessment - 02/04/20 0935    Subjective COVID-19 screen performed prior to patient entering clinic.  Doing well.    Pertinent History Thoracic surgery and associated LE numbness, Hep C.    Limitations Walking    How long can you walk comfortably? Around house.    Diagnostic tests See under "Imaging" tab.    Patient Stated Goals Get back to pre-accident status.    Currently in Pain? Yes    Pain Score 5     Pain Location Hip    Pain Orientation Left    Pain Descriptors / Indicators Aching    Pain Type Surgical pain    Pain Onset More than a month ago    Pain Score 4    Pain Orientation Left              [facility] PT Assessment - 02/04/20 0001      PROM   Overall PROM Comments Passive left shoulder flexion to 145 degrees today.  [facility] Adult PT Treatment/Exercise - 02/04/20 0001      Exercises   Exercises Knee/Hip      Knee/Hip Exercises: Aerobic   Nustep Level 6 x 15 minutes.      Knee/Hip Exercises: Machines for Strengthening   Cybex Knee Extension 30# x 3 minutes.    Cybex Knee Flexion 50# x 3 minutes.      Shoulder Exercises: ROM/Strengthening   UBE (Upper Arm Bike) 8 minutes at 30 RPM's.      Manual Therapy   Manual Therapy Passive ROM    Passive ROM In supine:  PROM x 5 minutes with focus on left shoulder flexion today.                       PT Long Term Goals - 12/10/19 1020      PT LONG TERM GOAL #1   Title Independent with a HEP.    Status On-going      PT LONG TERM GOAL #2   Title Active left shoulder flexion to 155 degrees so the patient can easily reach overhead.    Status  On-going      PT LONG TERM GOAL #3   Title Active left shoulder ER to 70 degrees+ to allow for easily donning/doffing of apparel.      PT LONG TERM GOAL #4   Title Increase ROM so patient is able to reach behind back to L3.    Status On-going      PT LONG TERM GOAL #5   Title Increase left shoulder and hip strength to a solid 4+/5 to increase stability for performance of functional activities.    Status On-going      PT LONG TERM GOAL #6   Title Perform ADL's with pain not > 3/10.    Status On-going      PT LONG TERM GOAL #7   Title Perform a reciprocating stair gait with one railing.    Status On-going      PT LONG TERM GOAL #8   Title Walk in clinic 500 feet without an assistive device with minimal deviation.    Status On-going                 Plan - 02/04/20 1003    Clinical Impression Statement Left shoulder PROM has improved to 145 degrees.    Personal Factors and Comorbidities Comorbidity 1;Comorbidity 2;Other    Comorbidities Thoracic surgery and associated LE numbness, Hep C.    Examination-Activity Limitations Locomotion Level;Other    Examination-Participation Restrictions Other    Stability/Clinical Decision Making Stable/Uncomplicated    Rehab Potential Good    PT Frequency 2x / week    PT Duration 8 weeks    PT Treatment/Interventions ADLs/Self Care Home Management;Cryotherapy;Electrical Stimulation;Moist Heat;Gait training;Stair training;Functional mobility training;Therapeutic activities;Therapeutic exercise;Balance training;Neuromuscular re-education;Manual techniques;Patient/family education;Passive range of motion    Consulted and Agree with Plan of Care Patient           Patient will benefit from skilled therapeutic intervention in order to improve the following deficits and impairments:  Increased fascial restricitons  Visit Diagnosis: Acute pain of left shoulder - Plan: PT plan of care cert/re-cert  Muscle weakness (generalized) - Plan: PT  plan of care cert/re-cert  Pain in left hip - Plan: PT plan of care cert/re-cert     Problem List Patient Active Problem List   Diagnosis Date Noted  . Wound dehiscence, surgical 10/01/2019  . Acute blood loss anemia 10/01/2019  .  Multiple traumatic injuries 09/22/2019  . Urinary hesitancy 09/18/2019  . Vitamin D insufficiency 09/14/2019  . Hepatitis C 09/12/2019  . 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 Closed fracture of left proximal humerus 09/12/2019  . Acetabulum fracture, left ([place]) 09/11/2019  . Low serum vitamin B12 04/09/2018  . Obesity 04/09/2018  . IVDU (intravenous drug user) 03/14/2018  . Essential hypertension 03/14/2018  . 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    Naveyah Iacovelli, [place] MPT 02/04/2020, 10:05 AM  [facility] [place], [place], [address] Phone: [phone]   Fax:  [phone]  Name: Shanard Treto MRN: [id number] Date of Birth: 1980/07/09

## 2020-02-08 ENCOUNTER — Ambulatory Visit: Payer: Self-pay | Attending: Orthopedic Surgery | Admitting: *Deleted

## 2020-02-08 DIAGNOSIS — M25552 Pain in left hip: Secondary | ICD-10-CM | POA: Insufficient documentation

## 2020-02-08 DIAGNOSIS — M25512 Pain in left shoulder: Secondary | ICD-10-CM | POA: Insufficient documentation

## 2020-02-08 DIAGNOSIS — M6281 Muscle weakness (generalized): Secondary | ICD-10-CM | POA: Insufficient documentation

## 2020-02-11 ENCOUNTER — Ambulatory Visit: Payer: Self-pay | Admitting: Physical Therapy

## 2020-02-15 ENCOUNTER — Ambulatory Visit: Payer: Self-pay | Admitting: *Deleted

## 2020-02-15 ENCOUNTER — Other Ambulatory Visit: Payer: Self-pay

## 2020-02-15 DIAGNOSIS — M6281 Muscle weakness (generalized): Secondary | ICD-10-CM

## 2020-02-15 DIAGNOSIS — M25552 Pain in left hip: Secondary | ICD-10-CM

## 2020-02-15 DIAGNOSIS — M25512 Pain in left shoulder: Secondary | ICD-10-CM
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
## 2020-02-18 ENCOUNTER — Other Ambulatory Visit: Payer: Self-pay

## 2020-02-18 ENCOUNTER — Ambulatory Visit: Payer: Self-pay | Admitting: Physical Therapy

## 2020-02-18 DIAGNOSIS — M6281 Muscle weakness (generalized): Secondary | ICD-10-CM

## 2020-02-18 DIAGNOSIS — M25552 Pain in left hip: Secondary | ICD-10-CM

## 2020-02-18 DIAGNOSIS — M25512 Pain in left shoulder: Secondary | ICD-10-CM
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
## 2020-02-22 ENCOUNTER — Other Ambulatory Visit: Payer: Self-pay

## 2020-02-22 ENCOUNTER — Ambulatory Visit: Payer: Self-pay | Admitting: Physical Therapy

## 2020-02-22 DIAGNOSIS — M25512 Pain in left shoulder: Secondary | ICD-10-CM

## 2020-02-22 DIAGNOSIS — M25552 Pain in left hip: Secondary | ICD-10-CM

## 2020-02-22 DIAGNOSIS — M6281 Muscle weakness (generalized): Secondary | ICD-10-CM
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
## 2020-02-25 ENCOUNTER — Ambulatory Visit: Payer: Self-pay | Admitting: Physical Therapy

## 2020-02-25 ENCOUNTER — Other Ambulatory Visit: Payer: Self-pay

## 2020-02-25 DIAGNOSIS — M6281 Muscle weakness (generalized): Secondary | ICD-10-CM

## 2020-02-25 DIAGNOSIS — M25552 Pain in left hip: Secondary | ICD-10-CM

## 2020-02-25 DIAGNOSIS — M25512 Pain in left shoulder: Secondary | ICD-10-CM
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
## 2020-02-28 ENCOUNTER — Ambulatory Visit: Payer: Self-pay | Admitting: Physical Therapy

## 2020-03-03 ENCOUNTER — Other Ambulatory Visit: Payer: Self-pay

## 2020-03-03 ENCOUNTER — Ambulatory Visit: Payer: Self-pay | Admitting: Physical Therapy

## 2020-03-03 DIAGNOSIS — M25512 Pain in left shoulder: Secondary | ICD-10-CM

## 2020-03-03 DIAGNOSIS — M25552 Pain in left hip: Secondary | ICD-10-CM

## 2020-03-03 DIAGNOSIS — M6281 Muscle weakness (generalized): Secondary | ICD-10-CM

## 2020-03-03 NOTE — Patient Instructions (Signed)
  DOUBLE LEG HEEL RAISES - CALF RAISES - STANDING  While standing, raise up on your toes as you lift your heels off the ground.    STANDING MARCHING - SINGLE LEG  While standing, lift your foot and knee up, set it down and then repeat on the same side.  Use your arms for support if needed for balance and safety.     HIP ABDUCTION - STANDING   While standing, raise your leg out to the side. Keep your knee straight and maintain your toes pointed forward the entire time.    Use your arms for balance support if needed for balance and safety.    HIP EXTENSION - STANDING  While standing, balance on one leg and move your other leg in a backward direction. Do not swing the leg. Perform smooth and controlled movements.   Keep your trunk stable and without arching during the movement.   Use your arms for support if needed for balance and safety.      SIT STAND - BOTH HANDS ASSIST  While seated in a chair, scoot forward towards the edge of the chair. Next, hold on to the arm rest with both hands for support and then raise up to standing.        Hip Bridge  Laying on your back with knees bent, contract glutes to pick up hips into a bridge position.    STRAIGHT LEG RAISE - SLR  While lying on your back, raise up your leg with a straight knee.  Keep the opposite knee bent with the foot planted on the ground.  Perform all exercises 10-20 reps with 1 set a few times a day    SHOULDER FLEXION  AAROM - SUPINE - CANE  Lying on  your back and holding a wand or cane, slowly raise the wand towards overhead. Use your unaffected arm to assist with the movement.    PROM Shoulder Internal Rotation (IR) with Towel - Up Back  In standing position, use a towel (as pictured) to stretch the involved arm up your back. Use the opposite arm by straightening it toward the sky to assist in pulling the involved arm up the back. Hold at end range for prescribed about amount of time, then  relax.    PECTORALIS CORNER STRETCH -   While standing at a corner of a wall, place your arms on the walls in the shape of a "W" so that your elbows are bent and pointed towards the ground as shown. Take one step forward towards the corner. Bend your front knee until a stretch is felt along the front of your chest and/or shoulders. Your arms should be pointed downward towards the ground.
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
## 2020-03-07 ENCOUNTER — Ambulatory Visit: Payer: Self-pay | Admitting: Physical Therapy

## 2020-09-16 ENCOUNTER — Encounter: Payer: Self-pay | Admitting: Radiation Oncology
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
